# Patient Record
Sex: Female | Born: 1990 | Race: Black or African American | Hispanic: No | Marital: Single | Smoking: Never smoker
Health system: Southern US, Community
[De-identification: ages and names within clinical notes are randomized; demographics above are authoritative.]

## PROBLEM LIST (undated history)

## (undated) DIAGNOSIS — I10 Essential (primary) hypertension: Secondary | ICD-10-CM

## (undated) DIAGNOSIS — N201 Calculus of ureter: Secondary | ICD-10-CM

## (undated) DIAGNOSIS — Z789 Other specified health status: Secondary | ICD-10-CM

## (undated) DIAGNOSIS — Z973 Presence of spectacles and contact lenses: Secondary | ICD-10-CM

## (undated) HISTORY — DX: Other specified health status: Z78.9

---
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
## 2013-04-10 HISTORY — PX: WISDOM TOOTH EXTRACTION: SHX21

## 2013-07-31 ENCOUNTER — Encounter (HOSPITAL_COMMUNITY): Payer: Self-pay | Admitting: Emergency Medicine

## 2013-07-31 ENCOUNTER — Emergency Department (HOSPITAL_COMMUNITY): Payer: BC Managed Care – PPO

## 2013-07-31 ENCOUNTER — Emergency Department (HOSPITAL_COMMUNITY)
Admission: EM | Admit: 2013-07-31 | Discharge: 2013-07-31 | Disposition: A | Discharge status: Home / Self Care | Payer: BC Managed Care – PPO | Attending: Emergency Medicine | Admitting: Emergency Medicine

## 2013-07-31 DIAGNOSIS — R7989 Other specified abnormal findings of blood chemistry: Secondary | ICD-10-CM

## 2013-07-31 DIAGNOSIS — R944 Abnormal results of kidney function studies: Secondary | ICD-10-CM | POA: Insufficient documentation

## 2013-07-31 DIAGNOSIS — R350 Frequency of micturition: Secondary | ICD-10-CM | POA: Insufficient documentation

## 2013-07-31 DIAGNOSIS — M545 Low back pain, unspecified: Secondary | ICD-10-CM | POA: Insufficient documentation

## 2013-07-31 DIAGNOSIS — N201 Calculus of ureter: Secondary | ICD-10-CM

## 2013-07-31 DIAGNOSIS — Z87440 Personal history of urinary (tract) infections: Secondary | ICD-10-CM | POA: Insufficient documentation

## 2013-07-31 DIAGNOSIS — H547 Unspecified visual loss: Secondary | ICD-10-CM | POA: Insufficient documentation

## 2013-07-31 DIAGNOSIS — N1330 Unspecified hydronephrosis: Secondary | ICD-10-CM

## 2013-07-31 LAB — COMPREHENSIVE METABOLIC PANEL
ALBUMIN: 4.9 g/dL (ref 3.5–5.2)
ALT: 11 U/L (ref 0–35)
AST: 21 U/L (ref 0–37)
Alkaline Phosphatase: 82 U/L (ref 39–117)
BUN: 14 mg/dL (ref 6–23)
CALCIUM: 10.6 mg/dL — AB (ref 8.4–10.5)
CHLORIDE: 100 meq/L (ref 96–112)
CO2: 23 meq/L (ref 19–32)
CREATININE: 1.19 mg/dL — AB (ref 0.50–1.10)
GFR calc Af Amer: 76 mL/min — ABNORMAL LOW (ref >90)
GFR calc non Af Amer: 65 mL/min — ABNORMAL LOW (ref >90)
GLUCOSE: 83 mg/dL (ref 70–99)
POTASSIUM: 4 meq/L (ref 3.7–5.3)
SODIUM: 139 meq/L (ref 137–147)
Total Bilirubin: 0.7 mg/dL (ref 0.3–1.2)
Total Protein: 8.9 g/dL — ABNORMAL HIGH (ref 6.0–8.3)

## 2013-07-31 LAB — CBC WITH DIFFERENTIAL/PLATELET
BASOS ABS: 0 10*3/uL (ref 0.0–0.1)
Basophils Relative: 0 % (ref 0–1)
Eosinophils Absolute: 0.1 10*3/uL (ref 0.0–0.7)
Eosinophils Relative: 1 % (ref 0–5)
HEMATOCRIT: 42.1 % (ref 36.0–46.0)
Hemoglobin: 14.1 g/dL (ref 12.0–15.0)
LYMPHS PCT: 18 % (ref 12–46)
Lymphs Abs: 2.7 10*3/uL (ref 0.7–4.0)
MCH: 29.4 pg (ref 26.0–34.0)
MCHC: 33.5 g/dL (ref 30.0–36.0)
MCV: 87.7 fL (ref 78.0–100.0)
MONO ABS: 1.6 10*3/uL — AB (ref 0.1–1.0)
Monocytes Relative: 10 % (ref 3–12)
NEUTROS PCT: 71 % (ref 43–77)
Neutro Abs: 10.6 10*3/uL — ABNORMAL HIGH (ref 1.7–7.7)
Platelets: 294 10*3/uL (ref 150–400)
RBC: 4.8 MIL/uL (ref 3.87–5.11)
RDW: 12.7 % (ref 11.5–15.5)
WBC: 14.9 10*3/uL — ABNORMAL HIGH (ref 4.0–10.5)

## 2013-07-31 LAB — LIPASE, BLOOD: Lipase: 20 U/L (ref 11–59)

## 2013-07-31 LAB — URINALYSIS, ROUTINE W REFLEX MICROSCOPIC
BILIRUBIN URINE: NEGATIVE
Glucose, UA: NEGATIVE mg/dL
Ketones, ur: >80 mg/dL — AB
LEUKOCYTES UA: NEGATIVE
NITRITE: NEGATIVE
PROTEIN: 30 mg/dL — AB
SPECIFIC GRAVITY, URINE: 1.035 — AB (ref 1.005–1.030)
Urobilinogen, UA: 1 mg/dL (ref 0.0–1.0)
pH: 6 (ref 5.0–8.0)

## 2013-07-31 LAB — POC URINE PREG, ED: PREG TEST UR: NEGATIVE

## 2013-07-31 LAB — URINE MICROSCOPIC-ADD ON

## 2013-07-31 MED ORDER — SODIUM CHLORIDE 0.9 % IV BOLUS (SEPSIS)
1000.0000 mL | Freq: Once | INTRAVENOUS | Status: AC
  Administered 2013-07-31: 1000 mL via INTRAVENOUS

## 2013-07-31 MED ORDER — MORPHINE SULFATE 4 MG/ML IJ SOLN
4.0000 mg | Freq: Once | INTRAMUSCULAR | Status: AC
  Administered 2013-07-31: 4 mg via INTRAVENOUS
  Filled 2013-07-31: qty 1

## 2013-07-31 MED ORDER — OXYCODONE-ACETAMINOPHEN 5-325 MG PO TABS
1.0000 | ORAL_TABLET | Freq: Once | ORAL | Status: AC
  Administered 2013-07-31: 1 via ORAL
  Filled 2013-07-31: qty 1

## 2013-07-31 MED ORDER — HYDROCODONE-ACETAMINOPHEN 5-325 MG PO TABS: 1.0000 | ORAL_TABLET | Freq: Four times a day (QID) | ORAL | Status: DC | PRN

## 2013-07-31 MED ORDER — PROMETHAZINE HCL 25 MG PO TABS: 25.0000 mg | ORAL_TABLET | Freq: Four times a day (QID) | ORAL | Status: DC | PRN

## 2013-07-31 MED ORDER — TAMSULOSIN HCL 0.4 MG PO CAPS
0.4000 mg | ORAL_CAPSULE | Freq: Every day | ORAL | Status: DC
Start: 2013-07-31 — End: 2013-08-05

## 2013-07-31 MED ORDER — HYDROCODONE-ACETAMINOPHEN 5-325 MG PO TABS: 1.0000 | ORAL_TABLET | ORAL | Status: DC | PRN

## 2013-07-31 MED ORDER — ONDANSETRON HCL 4 MG/2ML IJ SOLN
4.0000 mg | Freq: Once | INTRAMUSCULAR | Status: AC
  Administered 2013-07-31: 4 mg via INTRAVENOUS
  Filled 2013-07-31: qty 2

## 2013-07-31 MED ORDER — KETOROLAC TROMETHAMINE 60 MG/2ML IM SOLN
60.0000 mg | Freq: Once | INTRAMUSCULAR | Status: AC
  Administered 2013-07-31: 60 mg via INTRAMUSCULAR
  Filled 2013-07-31: qty 2

## 2013-07-31 MED ORDER — PHENAZOPYRIDINE HCL 200 MG PO TABS
200.0000 mg | ORAL_TABLET | Freq: Three times a day (TID) | ORAL | Status: DC
Start: 2013-07-31 — End: 2017-01-16

## 2013-07-31 NOTE — ED Provider Notes (Signed)
CSN: [id number]     Arrival date & time 07/31/13  1130 History   First MD Initiated Contact with Patient 07/31/13 1218     Chief Complaint  Patient presents with  . Abdominal Pain     (Consider location/radiation/quality/duration/timing/severity/associated sxs/prior Treatment) HPI  Misty Thompson is a(n) 23 y.o. female who presents to the emergency department from [facility]. Patient states that she was told that she needs to come to the ED for r/o of appendicits. Patient states that last night she developed abdominal pain in her epigastrium, Lower abdomen and lower back. She states that she has been having several days of urinary frequency. . Patient states that last night her pain became very bad. She one episode of vomiting. She denies a history of urinary tract infections, history of kidney stones, previous abdominal surgeries. LMP 2 weeks ago.  She endorses chills without fever. Denies vaginal sxs.   History reviewed. No pertinent past medical history. History reviewed. No pertinent past surgical history. No family history on file. History  Substance Use Topics  . Smoking status: Never Smoker   . Smokeless tobacco: Not on file  . Alcohol Use: No   OB History   Grav Para Term Preterm Abortions TAB SAB Ect Mult Living            0     Review of Systems   Ten systems reviewed and are negative for acute change, except as noted in the HPI.   Allergies  Review of patient's allergies indicates no known allergies.  Home Medications   Prior to Admission medications   Medication Sig Start Date End Date Taking? Authorizing Provider  ibuprofen (ADVIL,MOTRIN) 200 MG tablet Take 200 mg by mouth every 6 (six) hours as needed.   Yes Historical Provider, MD  medroxyPROGESTERone (DEPO-PROVERA) 150 MG/ML injection Inject 150 mg into the muscle every 3 (three) months.   Yes Historical Provider, MD   BP 126/74  Pulse 65  Temp(Src) 98.9 F (37.2 C) (Oral)  Resp 18  SpO2  100% Physical Exam Physical Exam  Nursing note and vitals reviewed. Constitutional: She is oriented to person, place, and time. She appears well-developed and well-nourished. No distress.  HENT:  Head: Normocephalic and atraumatic.  Eyes: Conjunctivae normal and EOM are normal. Pupils are equal, round, and reactive to light. No scleral icterus.  Neck: Normal range of motion.  Cardiovascular: Normal rate, regular rhythm and normal heart sounds.  Exam reveals no gallop and no friction rub.   No murmur heard. Pulmonary/Chest: Effort normal and breath sounds normal. No respiratory distress.  Abdominal: Diffuse abdominal tenderness. No rebound, no guarding. No masses. + cva tenderness R side.  Neurological: She is alert and oriented to person, place, and time.  Skin: Skin is warm and dry. She is not diaphoretic.    ED Course  Procedures (including critical care time) Labs Review Labs Reviewed  CBC WITH DIFFERENTIAL - Abnormal; Notable for the following:    WBC 14.9 (*)    Neutro Abs 10.6 (*)    Monocytes Absolute 1.6 (*)    All other components within normal limits  COMPREHENSIVE METABOLIC PANEL - Abnormal; Notable for the following:    Creatinine, Ser 1.19 (*)    Calcium 10.6 (*)    Total Protein 8.9 (*)    GFR calc non Af Amer 65 (*)    GFR calc Af Amer 76 (*)    All other components within normal limits  LIPASE, BLOOD  URINALYSIS,  ROUTINE W REFLEX MICROSCOPIC  POC URINE PREG, ED    Imaging Review No results found.   EKG Interpretation None      MDM   Final diagnoses:  None    Patient here for abdominal pain. I have very low suspicion for acute appendicitis. I believe her sxs are secondary to  A UTI. Patient has diffuse tenderness. Will awai labs. Pain control initiated.  4:48 PM Filed Vitals:   07/31/13 1315 07/31/13 1343 07/31/13 1345 07/31/13 1457  BP: 137/80 135/73 129/64 125/68  Pulse: 87 63 60 62  Temp:      TempSrc:      Resp: 17 18 20 20   SpO2: 100%  100% 100% 99%    Patient labs show leukocytosis. She awaiting a UA / urine preg.patient given pain medicaiton nand on repeat exam she has no focal abdominal tenderness. Patient has elevated creatinine and should not get any IV contrast if her urine comes back negative.  I anticipate discharge.     Arthor CaptainAbigail Harris, PA-C 07/31/13 1652

## 2013-07-31 NOTE — ED Provider Notes (Signed)
4:32 PM Patient signed out to me at end of shift by Arthor CaptainAbigail Harris, PA-C.  Pt with urinary frequency, abdominal pain, vomiting, chills.  Sent from urgent care for r/o appendicitis.  Per PA Harris, exam has not been concerning for appendicitis but is concerning for early pyelonephritis.  She has right CVA tenderness. UA pending.  Pt also reported pain free with pain medication.    5:47 PM Patient reports pain is improved.  Pain is indicated on right flank, is intermittent, moves location.  UA shows hematuria, pt not menstruating.  Mother has hx kidney stones.  CT abd/pelvis without contrast.  Pt no longer has IV.  Discussed with PA Harris prior to her leaving, as she doubts appendicitis and pt has renal insufficiency, will not scan with contrast.  Pt also has negative McBurney's point tenderness.  Doubt appendicitis.    6:42 PM Patient with right ureteral stone.  D/C home with vicodin (patient preference), nausea medications, flomax, urology follow up.  Discussed result, findings, treatment, and follow up  with patient.  Pt given return precautions.  Pt verbalizes understanding and agrees with plan.       Results for orders placed during the hospital encounter of 07/31/13  CBC WITH DIFFERENTIAL      Result Value Ref Range   WBC 14.9 (*) 4.0 - 10.5 K/uL   RBC 4.80  3.87 - 5.11 MIL/uL   Hemoglobin 14.1  12.0 - 15.0 g/dL   HCT 16.142.1  09.636.0 - 04.546.0 %   MCV 87.7  78.0 - 100.0 fL   MCH 29.4  26.0 - 34.0 pg   MCHC 33.5  30.0 - 36.0 g/dL   RDW 40.912.7  81.111.5 - 91.415.5 %   Platelets 294  150 - 400 K/uL   Neutrophils Relative % 71  43 - 77 %   Neutro Abs 10.6 (*) 1.7 - 7.7 K/uL   Lymphocytes Relative 18  12 - 46 %   Lymphs Abs 2.7  0.7 - 4.0 K/uL   Monocytes Relative 10  3 - 12 %   Monocytes Absolute 1.6 (*) 0.1 - 1.0 K/uL   Eosinophils Relative 1  0 - 5 %   Eosinophils Absolute 0.1  0.0 - 0.7 K/uL   Basophils Relative 0  0 - 1 %   Basophils Absolute 0.0  0.0 - 0.1 K/uL  COMPREHENSIVE METABOLIC PANEL   Result Value Ref Range   Sodium 139  137 - 147 mEq/L   Potassium 4.0  3.7 - 5.3 mEq/L   Chloride 100  96 - 112 mEq/L   CO2 23  19 - 32 mEq/L   Glucose, Bld 83  70 - 99 mg/dL   BUN 14  6 - 23 mg/dL   Creatinine, Ser 7.821.19 (*) 0.50 - 1.10 mg/dL   Calcium 95.610.6 (*) 8.4 - 10.5 mg/dL   Total Protein 8.9 (*) 6.0 - 8.3 g/dL   Albumin 4.9  3.5 - 5.2 g/dL   AST 21  0 - 37 U/L   ALT 11  0 - 35 U/L   Alkaline Phosphatase 82  39 - 117 U/L   Total Bilirubin 0.7  0.3 - 1.2 mg/dL   GFR calc non Af Amer 65 (*) >90 mL/min   GFR calc Af Amer 76 (*) >90 mL/min  LIPASE, BLOOD      Result Value Ref Range   Lipase 20  11 - 59 U/L  URINALYSIS, ROUTINE W REFLEX MICROSCOPIC      Result Value Ref  Range   Color, Urine YELLOW  YELLOW   APPearance CLEAR  CLEAR   Specific Gravity, Urine 1.035 (*) 1.005 - 1.030   pH 6.0  5.0 - 8.0   Glucose, UA NEGATIVE  NEGATIVE mg/dL   Hgb urine dipstick LARGE (*) NEGATIVE   Bilirubin Urine NEGATIVE  NEGATIVE   Ketones, ur >80 (*) NEGATIVE mg/dL   Protein, ur 30 (*) NEGATIVE mg/dL   Urobilinogen, UA 1.0  0.0 - 1.0 mg/dL   Nitrite NEGATIVE  NEGATIVE   Leukocytes, UA NEGATIVE  NEGATIVE  URINE MICROSCOPIC-ADD ON      Result Value Ref Range   Squamous Epithelial / LPF FEW (*) RARE   RBC / HPF 7-10  <3 RBC/hpf   Urine-Other MUCOUS PRESENT    POC URINE PREG, ED      Result Value Ref Range   Preg Test, Ur NEGATIVE  NEGATIVE   Ct Abdomen Pelvis Wo Contrast  07/31/2013   CLINICAL DATA:  RIGHT flank pain, microscopic hematuria, upper epigastric pain, constipation  EXAM: CT ABDOMEN AND PELVIS WITHOUT CONTRAST  TECHNIQUE: Multidetector CT imaging of the abdomen and pelvis was performed following the standard protocol without IV contrast. Sagittal and coronal MPR images reconstructed from axial data set. Oral contrast not administered for this indication.  COMPARISON:  COMPARISON None  FINDINGS: Lung bases clear.  RIGHT hydronephrosis and hydroureter secondary to a 3 mm distal RIGHT  ureteral calculus image 69.  Bladder decompressed.  No additional urinary tract calcifications or LEFT hydroureteronephrosis.  Within limits of a nonenhanced exam, liver, spleen, pancreas, kidneys, and adrenal glands otherwise unremarkable.  Normal appendix.  Uterus and adnexae unremarkable.  Stomach and bowel loops grossly normal appearance for technique.  No mass, adenopathy, free fluid, or free air.  Bones unremarkable.  IMPRESSION: RIGHT hydronephrosis and hydroureter secondary to a 3 mm distal RIGHT ureteral calculus.   Electronically Signed   By: Ulyses SouthwardMark  Boles M.D.   On: 07/31/2013 18:11      Trixie Dredgemily West, PA-C 07/31/13 1842

## 2013-07-31 NOTE — Discharge Instructions (Signed)
You have been seen today for your complaint of pain with urination. Your lab work showed kidney stone.  Your discharge medications include: 1) Norco=Do not drive, operate heavy machinery, drink alcohol, or take other tylenol containing products with this medicine.] 2) Pyridium This medication will help relieve pain and burning but does not treat the infection.  Make sure that you wear a panty liner as it may stain your underwear. Home care instructions are as follows:  1) please drink plenty of water, avoid tea and beverages with caffeine like coffee or soda 2) if you are sexually active, ,make sure to urinate immediately after intercourse Follow up with: your doctor or the emergency department Please seek immediate medical care if you develop any of the following symptoms: SEEK MEDICAL CARE IF:  You have back pain.  You develop a fever.  Your symptoms do not begin to resolve within 3 days.  SEEK IMMEDIATE MEDICAL CARE IF:  You have severe back pain or lower abdominal pain.  You develop chills.  You have nausea or vomiting.  You have continued burning or discomfort with urination.  Read the information below.  Use the prescribed medication as directed.  Please discuss all new medications with your pharmacist.  Do not take additional tylenol while taking the prescribed pain medication to avoid overdose.  You may return to the Emergency Department at any time for worsening condition or any new symptoms that concern you.  If you develop high fevers, worsening abdominal or flank pain, uncontrolled vomiting, or are unable to tolerate fluids by mouth, return to the ER for a recheck.      Kidney Stones Kidney stones (urolithiasis) are deposits that form inside your kidneys. The intense pain is caused by the stone moving through the urinary tract. When the stone moves, the ureter goes into spasm around the stone. The stone is usually passed in the urine.  CAUSES   A disorder that makes certain  neck glands produce too much parathyroid hormone (primary hyperparathyroidism).  A buildup of uric acid crystals, similar to gout in your joints.  Narrowing (stricture) of the ureter.  A kidney obstruction present at birth (congenital obstruction).  Previous surgery on the kidney or ureters.  Numerous kidney infections. SYMPTOMS   Feeling sick to your stomach (nauseous).  Throwing up (vomiting).  Blood in the urine (hematuria).  Pain that usually spreads (radiates) to the groin.  Frequency or urgency of urination. DIAGNOSIS   Taking a history and physical exam.  Blood or urine tests.  CT scan.  Occasionally, an examination of the inside of the urinary bladder (cystoscopy) is performed. TREATMENT   Observation.  Increasing your fluid intake.  Extracorporeal shock wave lithotripsy--This is a noninvasive procedure that uses shock waves to break up kidney stones.  Surgery may be needed if you have severe pain or persistent obstruction. There are various surgical procedures. Most of the procedures are performed with the use of small instruments. Only small incisions are needed to accommodate these instruments, so recovery time is minimized. The size, location, and chemical composition are all important variables that will determine the proper choice of action for you. Talk to your health care provider to better understand your situation so that you will minimize the risk of injury to yourself and your kidney.  HOME CARE INSTRUCTIONS   Drink enough water and fluids to keep your urine clear or pale yellow. This will help you to pass the stone or stone fragments.  Strain all urine through  the provided strainer. Keep all particulate matter and stones for your health care provider to see. The stone causing the pain may be as small as a grain of salt. It is very important to use the strainer each and every time you pass your urine. The collection of your stone will allow your health  care provider to analyze it and verify that a stone has actually passed. The stone analysis will often identify what you can do to reduce the incidence of recurrences.  Only take over-the-counter or prescription medicines for pain, discomfort, or fever as directed by your health care provider.  Make a follow-up appointment with your health care provider as directed.  Get follow-up X-rays if required. The absence of pain does not always mean that the stone has passed. It may have only stopped moving. If the urine remains completely obstructed, it can cause loss of kidney function or even complete destruction of the kidney. It is your responsibility to make sure X-rays and follow-ups are completed. Ultrasounds of the kidney can show blockages and the status of the kidney. Ultrasounds are not associated with any radiation and can be performed easily in a matter of minutes. SEEK MEDICAL CARE IF:  You experience pain that is progressive and unresponsive to any pain medicine you have been prescribed. SEEK IMMEDIATE MEDICAL CARE IF:   Pain cannot be controlled with the prescribed medicine.  You have a fever or shaking chills.  The severity or intensity of pain increases over 18 hours and is not relieved by pain medicine.  You develop a new onset of abdominal pain.  You feel faint or pass out.  You are unable to urinate. MAKE SURE YOU:   Understand these instructions.  Will watch your condition.  Will get help right away if you are not doing well or get worse. Document Released: 01/27/2005 Document Revised: 09/29/2012 Document Reviewed: 06/30/2012 [facility] Patient Information 2015 [employer] [place] [place]. This information is not intended to replace advice given to you by your health care provider. Make sure you discuss any questions you have with your health care provider.

## 2013-07-31 NOTE — ED Notes (Addendum)
[facility] reports 10[facility]10 RLQ pain since last night progressively worsening with nausea. Sent for r/o appendicitis. [facility] Pt reports chills last night. Pt in NAD.

## 2013-07-31 NOTE — ED Notes (Signed)
Unable to est IV access. Asking another nurse to try

## 2013-07-31 NOTE — ED Notes (Signed)
PT states she started having epigastric, lower abd, and R flank pain last night. PT has been nauseous w/ emesis x1 and constipated. States she's felt like she had fever and chills since yesterday. PT reports 10/10 pain. Denies hx of ovarian cysts or kidney stones.

## 2013-08-01 LAB — URINE CULTURE: Colony Count: 10000

## 2013-08-01 NOTE — ED Provider Notes (Signed)
Medical screening examination/treatment/procedure(s) were performed by non-physician practitioner and as supervising physician I was immediately available for consultation/collaboration.   EKG Interpretation None        Candyce ChurnJohn David Wofford III, MD 08/01/13 [id number]

## 2013-08-01 NOTE — ED Provider Notes (Signed)
Medical screening examination/treatment/procedure(s) were performed by non-physician practitioner and as supervising physician I was immediately available for consultation/collaboration.   EKG Interpretation None        Candyce ChurnJohn David Wofford III, MD 08/01/13 [phone]

## 2013-08-04 ENCOUNTER — Encounter (HOSPITAL_BASED_OUTPATIENT_CLINIC_OR_DEPARTMENT_OTHER): Payer: Self-pay | Admitting: *Deleted

## 2013-08-04 ENCOUNTER — Other Ambulatory Visit: Payer: Self-pay | Admitting: Urology

## 2013-08-04 NOTE — Progress Notes (Signed)
NPO AFTER MN. ARRIVE AT 0600. NEEDS KUB.  CURRENT LAB RESULTS IN CHART AND EPIC. MAY TAKE RX PAIN/ NAUSEA IF NEEDED W/ SIPS OF WATER AM DOS.

## 2013-08-04 NOTE — Anesthesia Preprocedure Evaluation (Addendum)
Anesthesia Evaluation  Patient identified by MRN, date of birth, ID band Patient awake    Reviewed: Allergy & Precautions, H&P , NPO status , Patient's Chart, lab work & pertinent test results  Airway Mallampati: II TM Distance: >3 FB Neck ROM: Full    Dental  (+) Dental Advisory Given   Pulmonary neg pulmonary ROS,  breath sounds clear to auscultation  Pulmonary exam normal       Cardiovascular negative cardio ROS  Rhythm:Regular Rate:Normal     Neuro/Psych negative neurological ROS  negative psych ROS   GI/Hepatic negative GI ROS, Neg liver ROS,   Endo/Other  negative endocrine ROS  Renal/GU negative Renal ROS  negative genitourinary   Musculoskeletal negative musculoskeletal ROS (+)   Abdominal   Peds  Hematology negative hematology ROS (+)   Anesthesia Other Findings Braces  Reproductive/Obstetrics negative OB ROS                          Anesthesia Physical Anesthesia Plan  ASA: I  Anesthesia Plan: General   Post-op Pain Management:    Induction: Intravenous  Airway Management Planned: LMA  Additional Equipment:   Intra-op Plan:   Post-operative Plan: Extubation in OR  Informed Consent: I have reviewed the patients History and Physical, chart, labs and discussed the procedure including the risks, benefits and alternatives for the proposed anesthesia with the patient or authorized representative who has indicated his/her understanding and acceptance.   Dental advisory given  Plan Discussed with: CRNA  Anesthesia Plan Comments:         Anesthesia Quick Evaluation

## 2013-08-05 ENCOUNTER — Ambulatory Visit (HOSPITAL_BASED_OUTPATIENT_CLINIC_OR_DEPARTMENT_OTHER): Payer: BC Managed Care – PPO | Admitting: Anesthesiology

## 2013-08-05 ENCOUNTER — Ambulatory Visit (HOSPITAL_COMMUNITY): Payer: BC Managed Care – PPO

## 2013-08-05 ENCOUNTER — Encounter (HOSPITAL_BASED_OUTPATIENT_CLINIC_OR_DEPARTMENT_OTHER): Payer: Self-pay | Admitting: *Deleted

## 2013-08-05 ENCOUNTER — Encounter (HOSPITAL_BASED_OUTPATIENT_CLINIC_OR_DEPARTMENT_OTHER): Payer: BC Managed Care – PPO | Admitting: Anesthesiology

## 2013-08-05 ENCOUNTER — Ambulatory Visit (HOSPITAL_BASED_OUTPATIENT_CLINIC_OR_DEPARTMENT_OTHER)
Admission: RE | Admit: 2013-08-05 | Discharge: 2013-08-05 | Disposition: A | Discharge status: Home / Self Care | Payer: BC Managed Care – PPO | Source: Ambulatory Visit | Attending: Urology | Admitting: Urology

## 2013-08-05 ENCOUNTER — Encounter (HOSPITAL_BASED_OUTPATIENT_CLINIC_OR_DEPARTMENT_OTHER)
Admission: RE | Disposition: A | Discharge status: Home / Self Care | Payer: Self-pay | Source: Ambulatory Visit | Attending: Urology

## 2013-08-05 DIAGNOSIS — Z79899 Other long term (current) drug therapy: Secondary | ICD-10-CM | POA: Insufficient documentation

## 2013-08-05 DIAGNOSIS — N201 Calculus of ureter: Secondary | ICD-10-CM | POA: Insufficient documentation

## 2013-08-05 HISTORY — DX: Presence of spectacles and contact lenses: Z97.3

## 2013-08-05 HISTORY — PX: CYSTOSCOPY WITH RETROGRADE PYELOGRAM, URETEROSCOPY AND STENT PLACEMENT: SHX5789

## 2013-08-05 HISTORY — DX: Calculus of ureter: N20.1

## 2013-08-05 SURGERY — CYSTOURETEROSCOPY, WITH RETROGRADE PYELOGRAM AND STENT INSERTION
Anesthesia: General | Site: Ureter | Laterality: Right

## 2013-08-05 MED ORDER — LACTATED RINGERS IV SOLN
INTRAVENOUS | Status: DC
  Filled 2013-08-05: qty 1000

## 2013-08-05 MED ORDER — ACETAMINOPHEN 10 MG/ML IV SOLN
INTRAVENOUS | Status: DC | PRN
  Administered 2013-08-05: 1000 mg via INTRAVENOUS

## 2013-08-05 MED ORDER — IOHEXOL 350 MG/ML SOLN
INTRAVENOUS | Status: DC | PRN
  Administered 2013-08-05: 35 mL

## 2013-08-05 MED ORDER — KETOROLAC TROMETHAMINE 30 MG/ML IJ SOLN
INTRAMUSCULAR | Status: DC | PRN
  Administered 2013-08-05: 30 mg via INTRAVENOUS

## 2013-08-05 MED ORDER — PROMETHAZINE HCL 25 MG/ML IJ SOLN
6.2500 mg | INTRAMUSCULAR | Status: DC | PRN
  Filled 2013-08-05: qty 1

## 2013-08-05 MED ORDER — MIDAZOLAM HCL 5 MG/5ML IJ SOLN
INTRAMUSCULAR | Status: DC | PRN
  Administered 2013-08-05 (×2): 1 mg via INTRAVENOUS

## 2013-08-05 MED ORDER — CEFAZOLIN SODIUM 1-5 GM-% IV SOLN
1.0000 g | INTRAVENOUS | Status: DC
  Filled 2013-08-05: qty 50

## 2013-08-05 MED ORDER — ONDANSETRON HCL 4 MG/2ML IJ SOLN
INTRAMUSCULAR | Status: DC | PRN
  Administered 2013-08-05: 4 mg via INTRAVENOUS

## 2013-08-05 MED ORDER — LIDOCAINE HCL (CARDIAC) 20 MG/ML IV SOLN
INTRAVENOUS | Status: DC | PRN
  Administered 2013-08-05: 60 mg via INTRAVENOUS

## 2013-08-05 MED ORDER — FENTANYL CITRATE 0.05 MG/ML IJ SOLN
INTRAMUSCULAR | Status: DC | PRN
  Administered 2013-08-05: 50 ug via INTRAVENOUS
  Administered 2013-08-05 (×2): 25 ug via INTRAVENOUS
  Administered 2013-08-05: 50 ug via INTRAVENOUS

## 2013-08-05 MED ORDER — HYDROMORPHONE HCL PF 1 MG/ML IJ SOLN
0.2500 mg | INTRAMUSCULAR | Status: DC | PRN
  Filled 2013-08-05: qty 1

## 2013-08-05 MED ORDER — FENTANYL CITRATE 0.05 MG/ML IJ SOLN
INTRAMUSCULAR | Status: AC
  Filled 2013-08-05: qty 4

## 2013-08-05 MED ORDER — SODIUM CHLORIDE 0.9 % IR SOLN
Status: DC | PRN
  Administered 2013-08-05: 4000 mL

## 2013-08-05 MED ORDER — DEXAMETHASONE SODIUM PHOSPHATE 4 MG/ML IJ SOLN
INTRAMUSCULAR | Status: DC | PRN
  Administered 2013-08-05: 10 mg via INTRAVENOUS

## 2013-08-05 MED ORDER — PHENAZOPYRIDINE HCL 100 MG PO TABS
ORAL_TABLET | ORAL | Status: AC
  Filled 2013-08-05: qty 2

## 2013-08-05 MED ORDER — PROPOFOL 10 MG/ML IV BOLUS
INTRAVENOUS | Status: DC | PRN
  Administered 2013-08-05: 200 mg via INTRAVENOUS

## 2013-08-05 MED ORDER — MIDAZOLAM HCL 2 MG/2ML IJ SOLN
INTRAMUSCULAR | Status: AC
Start: 2013-08-05 — End: 2013-08-05
  Filled 2013-08-05: qty 2

## 2013-08-05 MED ORDER — LACTATED RINGERS IV SOLN
INTRAVENOUS | Status: DC
  Administered 2013-08-05: 07:00:00 via INTRAVENOUS
  Filled 2013-08-05: qty 1000

## 2013-08-05 MED ORDER — CEFAZOLIN SODIUM-DEXTROSE 2-3 GM-% IV SOLR
2.0000 g | INTRAVENOUS | Status: AC
  Administered 2013-08-05: 2 g via INTRAVENOUS
  Filled 2013-08-05: qty 50

## 2013-08-05 MED ORDER — PHENAZOPYRIDINE HCL 200 MG PO TABS
200.0000 mg | ORAL_TABLET | Freq: Three times a day (TID) | ORAL | Status: DC | PRN
  Administered 2013-08-05: 200 mg via ORAL
  Filled 2013-08-05: qty 1

## 2013-08-05 SURGICAL SUPPLY — 39 items
ADAPTER CATH URET PLST 4-6FR (CATHETERS) IMPLANT
ADPR CATH URET STRL DISP 4-6FR (CATHETERS)
BAG DRAIN URO-CYSTO SKYTR STRL (DRAIN) ×3 IMPLANT
BAG DRN UROCATH (DRAIN) ×2
BASKET LASER NITINOL 1.9FR (BASKET) IMPLANT
BASKET STNLS GEMINI 4WIRE 3FR (BASKET) IMPLANT
BASKET ZERO TIP NITINOL 2.4FR (BASKET) ×2 IMPLANT
BSKT STON RTRVL 120 1.9FR (BASKET)
BSKT STON RTRVL GEM 120X11 3FR (BASKET)
BSKT STON RTRVL ZERO TP 2.4FR (BASKET) ×2
CANISTER SUCT LVC 12 LTR MEDI- (MISCELLANEOUS) ×2 IMPLANT
CATH INTERMIT  6FR 70CM (CATHETERS) IMPLANT
CATH URET 5FR 28IN CONE TIP (BALLOONS) ×1
CATH URET 5FR 28IN OPEN ENDED (CATHETERS) IMPLANT
CATH URET 5FR 70CM CONE TIP (BALLOONS) ×1 IMPLANT
CLOTH BEACON ORANGE TIMEOUT ST (SAFETY) ×3 IMPLANT
CONTOUR ×2 IMPLANT
DRAPE CAMERA CLOSED 9X96 (DRAPES) IMPLANT
FIBER LASER FLEXIVA 1000 (UROLOGICAL SUPPLIES) IMPLANT
FIBER LASER FLEXIVA 200 (UROLOGICAL SUPPLIES) IMPLANT
FIBER LASER FLEXIVA 365 (UROLOGICAL SUPPLIES) IMPLANT
FIBER LASER FLEXIVA 550 (UROLOGICAL SUPPLIES) IMPLANT
GLOVE BIO SURGEON STRL SZ7 (GLOVE) ×3 IMPLANT
GLOVE BIOGEL M STER SZ 6 (GLOVE) ×2 IMPLANT
GLOVE BIOGEL PI IND STRL 6.5 (GLOVE) ×2 IMPLANT
GLOVE BIOGEL PI INDICATOR 6.5 (GLOVE) ×2
GOWN STRL REUS W/TWL LRG LVL3 (GOWN DISPOSABLE) ×4 IMPLANT
GUIDEWIRE 0.038 PTFE COATED (WIRE) ×3 IMPLANT
GUIDEWIRE ANG ZIPWIRE 038X150 (WIRE) IMPLANT
GUIDEWIRE STR DUAL SENSOR (WIRE) ×2 IMPLANT
KIT BALLIN UROMAX 15FX10 (LABEL) IMPLANT
KIT BALLN UROMAX 15FX4 (MISCELLANEOUS) IMPLANT
KIT BALLN UROMAX 26 75X4 (MISCELLANEOUS)
NS IRRIG 500ML POUR BTL (IV SOLUTION) IMPLANT
PACK CYSTOSCOPY (CUSTOM PROCEDURE TRAY) ×3 IMPLANT
SET HIGH PRES BAL DIL (LABEL)
SHEATH URET ACCESS 12FR/35CM (UROLOGICAL SUPPLIES) ×2 IMPLANT
SHEATH URET ACCESS 12FR/55CM (UROLOGICAL SUPPLIES) IMPLANT
STENT URET 6FRX24 CONTOUR (STENTS) ×2 IMPLANT

## 2013-08-05 NOTE — Op Note (Signed)
Antony Hastealiyah M Hatton is a 23 y.o.   08/05/2013  General  Pre-op diagnosis: Right distal ureteral calculus.  Postop diagnosis: Same  Procedure done: Cystoscopy, right retrograde pyelogram, ureteroscopy with stone extraction, insertion of double-J stent  Surgeon: Wendie SimmerMarc H. Nesi  Anesthesia: General  Indication: Patient is a 23 years old female who was seen in emergency room on 06/20 for sudden onset of severe right flank pain associated with nausea and vomiting. CT scan showed a 3 mm stone in the right distal ureter. She was sent home on analgesics and tamsulosin. She has continued to have pain. She takes pain medication every 4 hours. And she has not passed the stone. KUB this morning showed a stone in the right and distal ureter. She is scheduled now for cystoscopy and stone manipulation  Procedure: The patient was identified by her wrist band and proper timeout was taken.  Under general anesthesia she was prepped and draped and placed in the dorsolithotomy position. A panendoscope was inserted in the bladder. The bladder mucosa is normal. There is no stone or tumor in the bladder. The ureteral orifices are in normal position and shape.  Right retrograde pyelogram:  A cone-tip catheter was passed through the cystoscope and the right ureteral orifice. Contrast was then injected through the cone-tip catheter. There is a filling defect in the distal ureter. The ureter proximal to the filling defect is moderately dilated. The cone-tip catheter was removed.  A sensor wire was passed through the cystoscope and the right ureter. The cystoscope was removed. A semirigid ureteroscope was then passed in the bladder but could not be passed through the ureteral orifice. The ureteroscope was removed. I then dilated the ureteral orifice with a ureteroscope access sheath. The ureteroscope was then passed in the bladder and through the ureter without difficulty. A small stone was identified in the distal ureter.  It was felt that this stone was small enough to be extracted without fragmentation. A Nitinol basket was passed and through the ureteroscope and the stone was caught within the wires of the basket and removed. The ureteroscope was then reinserted in the ureter. Contrast was injected through the ureteroscope. There is no evidence of remaining filling defect in the ureter and there is no extravasation of contrast. The mid and proximal ureter are normal. The renal pelvis and calyces are also normal. The ureteroscope was removed.  The sensor wire was backloaded into the cystoscope and a #6 [place] last 24 double-J stent was passed over the sensor wire. The proximal curl of the double-J stent is in the renal pelvis and the distal curl is in the bladder. The bladder was then emptied and the cystoscope and the sensor wire were removed.  Patient tolerated the procedure well and left the OR in satisfactory condition to postanesthesia care unit.

## 2013-08-05 NOTE — Transfer of Care (Signed)
Immediate Anesthesia Transfer of Care Note  Patient: Misty Thompson  Procedure(s) Performed: Procedure(s) (LRB): CYSTOSCOPY WITH RETROGRADE PYELOGRAM, URETEROSCOPY AND STENT PLACEMENT (Right)  Patient Location: PACU  Anesthesia Type: General  Level of Consciousness: awake, alert  and oriented  Airway & Oxygen Therapy: Patient Spontanous Breathing and Patient connected to face mask oxygen  Post-op Assessment: Report given to PACU RN and Post -op Vital signs reviewed and stable  Post vital signs: Reviewed and stable  Complications: No apparent anesthesia complications

## 2013-08-05 NOTE — H&P (Addendum)
History of Present Illness Misty Thompson was seen in the ER on 6/20 for sudden onset of severe right flank pain associated with nausea and vomiting. CT scan showed a 3 mm stone in the right distal ureter. She was sent home on analgesics and tamsulosin. She has not passed a stone. She has been having right flank pain radiating to the right lower quadrant since. She takes pain medication every 4 hours. She does not have a past history of kidney stone.   Past Medical History Problems  1. History of renal calculi (V13.01)  Surgical History Problems  1. History of Oral Surgery  Current Meds 1. Hydrocodone-Acetaminophen TABS;  Therapy: (Recorded:25Jun2015) to Recorded 2. Phenergan TABS (Promethazine HCl);  Therapy: (Recorded:25Jun2015) to Recorded 3. Tamsulosin HCl - 0.4 MG Oral Capsule;  Therapy: (Recorded:25Jun2015) to Recorded  Allergies Medication  1. No Known Drug Allergies  Family History Problems  1. Family history of hypertension (V17.49) : Mother, Father  Social History Problems    Denied: History of Alcohol use   Denied: History of Caffeine use   Father alive and healthy   5752   Mother alive and healthy   6149   Never a smoker   Occupation   Consulting civil engineerstudent   Single  Review of Systems Genitourinary, constitutional, skin, eye, otolaryngeal, hematologic/lymphatic, cardiovascular, pulmonary, endocrine, musculoskeletal, gastrointestinal, neurological and psychiatric system(s) were reviewed and pertinent findings if present are noted.  Gastrointestinal: nausea, vomiting, flank pain and constipation1 .     1 Amended By: Su GrandNesi, Marc; Aug 04 2013 3:31 PM EST  Vitals Vital Signs [Data Includes: Last 1 Day]  Recorded: 25Jun2015 02:43PM  Height: 5 ft 2 in Weight: 164 lb  BMI Calculated: 30 BSA Calculated: 1.76 Blood Pressure: 118 / 74 Temperature: 98.4 F Heart Rate: 72 Respiration: 18  Physical Exam Constitutional: Well nourished and well developed . No acute distress.   ENT:. The ears and nose are normal in appearance.  Neck: The appearance of the neck is normal and no neck mass is present.  Pulmonary: No respiratory distress and normal respiratory rhythm and effort.  Cardiovascular: Heart rate and rhythm are normal . No peripheral edema.  Abdomen: The abdomen is soft and nontender. No masses are palpated. No CVA tenderness. No hernias are palpable. No hepatosplenomegaly noted.  Genitourinary:  The bladder is non tender and not distended.  Lymphatics: The femoral and inguinal nodes are not enlarged or tender.  Skin: Normal skin turgor, no visible rash and no visible skin lesions.  Neuro/Psych:. Mood and affect are appropriate.    Results/Data Urine [Data Includes: Last 1 Day]   25Jun2015  COLOR YELLOW   APPEARANCE CLEAR   SPECIFIC GRAVITY 1.020   pH 6.0   GLUCOSE NEG mg/dL  BILIRUBIN NEG   KETONE NEG mg/dL  BLOOD LARGE   PROTEIN NEG mg/dL  UROBILINOGEN 0.2 mg/dL  NITRITE NEG   LEUKOCYTE ESTERASE NEG   SQUAMOUS EPITHELIAL/HPF RARE   WBC 0-2 WBC/hpf  RBC 3-6 RBC/hpf  BACTERIA NONE SEEN   CRYSTALS NONE SEEN   CASTS NONE SEEN    I independently reviewed the CT scan and the findings are as noted above.   Assessment Assessed  1. Calculus of distal right ureter (592.1)  Plan Health Maintenance  1. UA With REFLEX; [Do Not Release]; Status:Resulted - Requires Verification;   Done:  25Jun2015 01:28PM  Since she is still symptomatic and has not passed stone I believe she needs stone manipulation.The procedure, risks, benefits were reviewed with the patient  and her mother. The risks include but are not limited to hemorrhage, infection, ureteral injury, inability to find the stone or to extract it. They understand and are agreeable.

## 2013-08-05 NOTE — Discharge Instructions (Addendum)
Post Anesthesia Home Care Instructions  Activity: Get plenty of rest for the remainder of the day. A responsible adult should stay with you for 24 hours following the procedure.  For the next 24 hours, DO NOT: -Drive a car -Operate machinery -Drink alcoholic beverages -Take any medication unless instructed by your physician -Make any legal decisions or sign important papers.  Meals: Start with liquid foods such as gelatin or soup. Progress to regular foods as tolerated. Avoid greasy, spicy, heavy foods. If nausea and/or vomiting occur, drink only clear liquids until the nausea and/or vomiting subsides. Call your physician if vomiting continues.  Special Instructions/Symptoms: Your throat may feel dry or sore from the anesthesia or the breathing tube placed in your throat during surgery. If this causes discomfort, gargle with warm salt water. The discomfort should disappear within 24 hours.      [facility] [phone] Post Ureteroscopy With or Without Stent Instructions  Definitions:  Ureter: The duct that transports urine from the kidney to the bladder. Stent:   A plastic hollow tube that is placed into the ureter, from the kidney to the bladder to prevent the ureter from swelling shut.  GENERAL INSTRUCTIONS:  Despite the fact that no skin incisions were used, the area around the ureter and bladder is raw and irritated. The stent is a foreign body which will further irritate the bladder wall. This irritation is manifested by increased frequency of urination, both day and night, and by an increase in the urge to urinate. In some, the urge to urinate is present almost always. Sometimes the urge is strong enough that you may not be able to stop yourself from urinating. The only real cure is to remove the stent and then give time for the bladder wall to heal which can't be done until the danger of the ureter swelling shut has passed, which varies.  You may see some  blood in your urine while the stent is in place and a few days afterwards. Do not be alarmed, even if the urine was clear for a while. Get off your feet and drink lots of fluids until clearing occurs. If you start to pass clots or don't improve, call us.  DIET: You may return to your normal diet immediately. Because of the raw surface of your bladder, alcohol, spicy foods, acid type foods and drinks with caffeine may cause irritation or frequency and should be used in moderation. To keep your urine flowing freely and to avoid constipation, drink plenty of fluids during the day ( 8-10 glasses ). Tip: Avoid cranberry juice because it is very acidic.  ACTIVITY: Your physical activity doesn't need to be restricted. However, if you are very active, you may see some blood in your urine. We suggest that you reduce your activity under these circumstances until the bleeding has stopped.  BOWELS: It is important to keep your bowels regular during the postoperative period. Straining with bowel movements can cause bleeding. A bowel movement every other day is reasonable. Use a mild laxative if needed, such as Milk of Magnesia 2-3 tablespoons, or 2 Dulcolax tablets. Call if you continue to have problems. If you have been taking narcotics for pain, before, during or after your surgery, you may be constipated. Take a laxative if necessary.   MEDICATION: You should resume your pre-surgery medications unless told not to. In addition you will often be given an antibiotic to prevent infection. These should be taken as prescribed until the bottles are finished unless   you are having an unusual reaction to one of the drugs.  PROBLEMS YOU SHOULD REPORT TO US: Fevers over 100.5 Fahrenheit. Heavy bleeding, or clots ( See above notes about blood in urine ). Inability to urinate. Drug reactions ( hives, rash, nausea, vomiting, diarrhea ). Severe burning or pain with urination that is not improving.  FOLLOW-UP: You will  need a follow-up appointment to monitor your progress. Call for this appointment at the number listed above. Usually the first appointment will be about three to fourteen days after your surgery.      

## 2013-08-05 NOTE — Anesthesia Postprocedure Evaluation (Signed)
Anesthesia Post Note  Patient: Misty Thompson  Procedure(s) Performed: Procedure(s) (LRB): CYSTOSCOPY WITH RETROGRADE PYELOGRAM, URETEROSCOPY AND STENT PLACEMENT (Right)  Anesthesia type: General  Patient location: PACU  Post pain: Pain level controlled  Post assessment: Post-op Vital signs reviewed  Last Vitals:  Filed Vitals:   08/05/13 0933  BP: 115/69  Pulse: 63  Temp: 36.3 C  Resp: 18    Post vital signs: Reviewed  Level of consciousness: sedated  Complications: No apparent anesthesia complications

## 2013-08-05 NOTE — Anesthesia Procedure Notes (Signed)
Procedure Name: LMA Insertion Date/Time: 08/05/2013 7:43 AM Performed by: Norva PavlovALLAWAY, ROBIN G Pre-anesthesia Checklist: Patient identified, Emergency Drugs available, Suction available and Patient being monitored Patient Re-evaluated:Patient Re-evaluated prior to inductionOxygen Delivery Method: Circle System Utilized Preoxygenation: Pre-oxygenation with 100% oxygen Intubation Type: IV induction Ventilation: Mask ventilation without difficulty LMA: LMA inserted LMA Size: 4.0 Number of attempts: 1 Airway Equipment and Method: bite block Placement Confirmation: positive ETCO2 Tube secured with: Tape Dental Injury: Teeth and Oropharynx as per pre-operative assessment

## 2013-08-08 ENCOUNTER — Encounter (HOSPITAL_BASED_OUTPATIENT_CLINIC_OR_DEPARTMENT_OTHER): Payer: Self-pay | Admitting: Urology

## 2017-01-15 ENCOUNTER — Encounter (HOSPITAL_COMMUNITY): Payer: Self-pay

## 2017-01-15 ENCOUNTER — Other Ambulatory Visit: Payer: Self-pay

## 2017-01-15 DIAGNOSIS — O26899 Other specified pregnancy related conditions, unspecified trimester: Secondary | ICD-10-CM | POA: Insufficient documentation

## 2017-01-15 DIAGNOSIS — Z3A00 Weeks of gestation of pregnancy not specified: Secondary | ICD-10-CM | POA: Diagnosis not present

## 2017-01-15 DIAGNOSIS — R350 Frequency of micturition: Secondary | ICD-10-CM | POA: Diagnosis not present

## 2017-01-15 DIAGNOSIS — R102 Pelvic and perineal pain: Secondary | ICD-10-CM | POA: Insufficient documentation

## 2017-01-15 DIAGNOSIS — R1030 Lower abdominal pain, unspecified: Secondary | ICD-10-CM | POA: Diagnosis present

## 2017-01-15 DIAGNOSIS — O23599 Infection of other part of genital tract in pregnancy, unspecified trimester: Secondary | ICD-10-CM | POA: Insufficient documentation

## 2017-01-15 LAB — URINALYSIS, ROUTINE W REFLEX MICROSCOPIC
BACTERIA UA: NONE SEEN
Bilirubin Urine: NEGATIVE
Glucose, UA: NEGATIVE mg/dL
Ketones, ur: NEGATIVE mg/dL
Leukocytes, UA: NEGATIVE
Nitrite: POSITIVE — AB
PH: 6 (ref 5.0–8.0)
Protein, ur: NEGATIVE mg/dL
SPECIFIC GRAVITY, URINE: 1.016 (ref 1.005–1.030)

## 2017-01-15 LAB — COMPREHENSIVE METABOLIC PANEL
ALT: 14 U/L (ref 14–54)
AST: 19 U/L (ref 15–41)
Albumin: 3.8 g/dL (ref 3.5–5.0)
Alkaline Phosphatase: 63 U/L (ref 38–126)
Anion gap: 6 (ref 5–15)
BUN: 11 mg/dL (ref 6–20)
CHLORIDE: 104 mmol/L (ref 101–111)
CO2: 24 mmol/L (ref 22–32)
Calcium: 9.5 mg/dL (ref 8.9–10.3)
Creatinine, Ser: 0.69 mg/dL (ref 0.44–1.00)
GFR calc Af Amer: >60 mL/min (ref >60)
GFR calc non Af Amer: >60 mL/min (ref >60)
GLUCOSE: 93 mg/dL (ref 65–99)
Potassium: 3.9 mmol/L (ref 3.5–5.1)
Sodium: 134 mmol/L — ABNORMAL LOW (ref 135–145)
Total Bilirubin: 0.1 mg/dL — ABNORMAL LOW (ref 0.3–1.2)
Total Protein: 7.2 g/dL (ref 6.5–8.1)

## 2017-01-15 LAB — CBC
HEMATOCRIT: 34.3 % — AB (ref 36.0–46.0)
Hemoglobin: 11.6 g/dL — ABNORMAL LOW (ref 12.0–15.0)
MCH: 29.8 pg (ref 26.0–34.0)
MCHC: 33.8 g/dL (ref 30.0–36.0)
MCV: 88.2 fL (ref 78.0–100.0)
Platelets: 313 10*3/uL (ref 150–400)
RBC: 3.89 MIL/uL (ref 3.87–5.11)
RDW: 13 % (ref 11.5–15.5)
WBC: 12.4 10*3/uL — ABNORMAL HIGH (ref 4.0–10.5)

## 2017-01-15 LAB — I-STAT BETA HCG BLOOD, ED (MC, WL, AP ONLY): I-stat hCG, quantitative: 163.6 m[IU]/mL — ABNORMAL HIGH (ref <5)

## 2017-01-15 LAB — LIPASE, BLOOD: LIPASE: 29 U/L (ref 11–51)

## 2017-01-15 NOTE — ED Triage Notes (Signed)
Pt endorses lower abd pain with urinary frequency and lower back pain since yesterday. VSS. Denies unprotected sex. Hx of kidney stone. Denies n/v/d.

## 2017-01-16 ENCOUNTER — Emergency Department (HOSPITAL_COMMUNITY): Payer: Medicaid Other

## 2017-01-16 ENCOUNTER — Emergency Department (HOSPITAL_COMMUNITY)
Admission: EM | Admit: 2017-01-16 | Discharge: 2017-01-16 | Disposition: A | Discharge status: Home / Self Care | Payer: Medicaid Other | Attending: Emergency Medicine | Admitting: Emergency Medicine

## 2017-01-16 ENCOUNTER — Other Ambulatory Visit (HOSPITAL_COMMUNITY): Payer: Self-pay

## 2017-01-16 DIAGNOSIS — R102 Pelvic and perineal pain: Secondary | ICD-10-CM

## 2017-01-16 DIAGNOSIS — O26899 Other specified pregnancy related conditions, unspecified trimester: Secondary | ICD-10-CM

## 2017-01-16 DIAGNOSIS — B9689 Other specified bacterial agents as the cause of diseases classified elsewhere: Secondary | ICD-10-CM

## 2017-01-16 DIAGNOSIS — N760 Acute vaginitis: Secondary | ICD-10-CM

## 2017-01-16 LAB — WET PREP, GENITAL
Sperm: NONE SEEN
Trich, Wet Prep: NONE SEEN
Yeast Wet Prep HPF POC: NONE SEEN

## 2017-01-16 LAB — HCG, QUANTITATIVE, PREGNANCY: hCG, Beta Chain, Quant, S: 173 m[IU]/mL — ABNORMAL HIGH (ref <5)

## 2017-01-16 LAB — GC/CHLAMYDIA PROBE AMP (~~LOC~~) NOT AT ARMC
Chlamydia: NEGATIVE
NEISSERIA GONORRHEA: NEGATIVE

## 2017-01-16 MED ORDER — METRONIDAZOLE 500 MG PO TABS: 500.0000 mg | ORAL_TABLET | Freq: Two times a day (BID) | ORAL | 0 refills | Status: DC

## 2017-01-16 MED ORDER — ACETAMINOPHEN 325 MG PO TABS
650.0000 mg | ORAL_TABLET | Freq: Once | ORAL | Status: AC
  Administered 2017-01-16: 650 mg via ORAL
  Filled 2017-01-16: qty 2

## 2017-01-16 MED ORDER — CEPHALEXIN 500 MG PO CAPS: 500.0000 mg | ORAL_CAPSULE | Freq: Four times a day (QID) | ORAL | 0 refills | Status: DC

## 2017-01-16 MED ORDER — PRENATAL COMPLETE 14-0.4 MG PO TABS: 1.0000 | ORAL_TABLET | Freq: Every day | ORAL | 0 refills | Status: DC

## 2017-01-16 NOTE — ED Notes (Signed)
Patient transported to Ultrasound 

## 2017-01-16 NOTE — ED Provider Notes (Signed)
[facility] Provider Note   CSN: [id number] Arrival date & time: 01/15/17  2106     History   Chief Complaint Chief Complaint  Patient presents with  . Abdominal Pain  . Urinary Frequency    HPI Misty Thompson is a 26 y.o. female.  Patient with history of kidney stone presenting with intermittent lower abdominal pain for the past 3 days.  Pain is worse in her midline and comes and goes waxing and waning in intensity.  Pain involves her low back as well.  Denies any dysuria hematuria.  Denies any abnormal vaginal bleeding or discharge.  Last menstrual cycle was beginning of November.  Denies any diarrhea or constipation.  Denies any chest pain or shortness of breath.  She is taking ibuprofen at home without relief.  Still has appendix and gallbladder.  Pain is not similar to previous kidney stone.   The history is provided by the patient.  Abdominal Pain   Associated symptoms include nausea, dysuria, frequency and hematuria. Pertinent negatives include fever, vomiting, constipation, headaches and myalgias.  Urinary Frequency  Associated symptoms include abdominal pain. Pertinent negatives include no chest pain, no headaches and no shortness of breath.    Past Medical History:  Diagnosis Date  . Right ureteral stone   . Wears contact lenses     There are no active problems to display for this patient.   Past Surgical History:  Procedure Laterality Date  . CYSTOSCOPY WITH RETROGRADE PYELOGRAM, URETEROSCOPY AND STENT PLACEMENT Right 08/05/2013   Procedure: CYSTOSCOPY WITH RETROGRADE PYELOGRAM, URETEROSCOPY AND STENT PLACEMENT;  Surgeon: Danae ChenMarc H Nesi, MD;  [facility] [place];  Service: Urology;  Laterality: Right;  . WISDOM TOOTH EXTRACTION  MARCH 2015    OB History    Gravida Para Term Preterm AB Living             0   SAB TAB Ectopic Multiple Live Births                   Home Medications    Prior to  Admission medications   Medication Sig Start Date End Date Taking? Authorizing Provider  HYDROcodone-acetaminophen (NORCO/VICODIN) 5-325 MG per tablet Take 1-2 tablets by mouth every 4 (four) hours as needed for moderate pain or severe pain. 07/31/13   Trixie DredgeWest, Emily, PA-C  ibuprofen (ADVIL,MOTRIN) 200 MG tablet Take 200 mg by mouth every 6 (six) hours as needed.    [provider]  medroxyPROGESTERone (DEPO-PROVERA) 150 MG/ML injection Inject 150 mg into the muscle every 3 (three) months. Last injection   April  2015    [provider]  phenazopyridine (PYRIDIUM) 200 MG tablet Take 1 tablet (200 mg total) by mouth 3 (three) times daily. 07/31/13   Arthor CaptainHarris, Abigail, PA-C  promethazine (PHENERGAN) 25 MG tablet Take 1 tablet (25 mg total) by mouth every 6 (six) hours as needed for nausea. 07/31/13   Trixie DredgeWest, Emily, PA-C    Family History History reviewed. No pertinent family history.  Social History Social History   Tobacco Use  . Smoking status: Never Smoker  . Smokeless tobacco: Never Used  Substance Use Topics  . Alcohol use: Yes    Comment: occ  . Drug use: No     Allergies   Patient has no known allergies.   Review of Systems Review of Systems  Constitutional: Negative for activity change, appetite change and fever.  HENT: Negative for congestion and rhinorrhea.   Respiratory:  Negative for cough, chest tightness and shortness of breath.   Cardiovascular: Negative for chest pain.  Gastrointestinal: Positive for abdominal pain and nausea. Negative for constipation and vomiting.  Genitourinary: Positive for dysuria, frequency and hematuria. Negative for vaginal bleeding and vaginal discharge.  Musculoskeletal: Positive for back pain. Negative for myalgias and neck pain.  Skin: Negative for rash.  Neurological: Negative for dizziness, weakness and headaches.    all other systems are negative except as noted in the HPI and PMH.    Physical Exam Updated Vital  Signs BP 133/68   Pulse 90   Temp 98.3 F (36.8 C) (Oral)   Resp 16   Ht 5\' 2"  (1.575 m)   Wt 81.6 kg (180 lb)   LMP  (LMP Unknown)   SpO2 98%   BMI 32.92 kg/m   Physical Exam  Constitutional: She is oriented to person, place, and time. She appears well-developed and well-nourished. No distress.  HENT:  Head: Normocephalic and atraumatic.  Mouth/Throat: Oropharynx is clear and moist. No oropharyngeal exudate.  Eyes: Conjunctivae and EOM are normal. Pupils are equal, round, and reactive to light.  Neck: Normal range of motion. Neck supple.  No meningismus.  Cardiovascular: Normal rate, regular rhythm, normal heart sounds and intact distal pulses.  No murmur heard. Pulmonary/Chest: Effort normal and breath sounds normal. No respiratory distress.  Abdominal: Soft. There is tenderness. There is no rebound and no guarding.  Mild suprapubic tenderness No RLQ tenderness No pain at McBurney's point  Genitourinary:  Genitourinary Comments: Chaperone present. Normal external genitalia.  White discharge in vaginal vault.  No CMT.  Midline uterus tenderness.  No lateralizing adnexal tenderness  Musculoskeletal: Normal range of motion. She exhibits no edema or tenderness.  No CVA tenderness  Neurological: She is alert and oriented to person, place, and time. No cranial nerve deficit. She exhibits normal muscle tone. Coordination normal.  No ataxia on finger to nose bilaterally. No pronator drift. 5/5 strength throughout. CN 2-12 intact.Equal grip strength. Sensation intact.   Skin: Skin is warm.  Psychiatric: She has a normal mood and affect. Her behavior is normal.  Nursing note and vitals reviewed.    ED Treatments / Results  Labs (all labs ordered are listed, but only abnormal results are displayed) Labs Reviewed  COMPREHENSIVE METABOLIC PANEL - Abnormal; Notable for the following components:      Result Value   Sodium 134 (*)    Total Bilirubin 0.1 (*)    All other components  within normal limits  CBC - Abnormal; Notable for the following components:   WBC 12.4 (*)    Hemoglobin 11.6 (*)    HCT 34.3 (*)    All other components within normal limits  URINALYSIS, ROUTINE W REFLEX MICROSCOPIC - Abnormal; Notable for the following components:   Color, Urine AMBER (*)    Hgb urine dipstick SMALL (*)    Nitrite POSITIVE (*)    Squamous Epithelial / LPF 0-5 (*)    All other components within normal limits  HCG, QUANTITATIVE, PREGNANCY - Abnormal; Notable for the following components:   hCG, Beta Chain, Quant, S 173 (*)    All other components within normal limits  I-STAT BETA HCG BLOOD, ED ([facility], [facility], [facility] ONLY) - Abnormal; Notable for the following components:   I-stat hCG, quantitative 163.6 (*)    All other components within normal limits  WET PREP, GENITAL  LIPASE, BLOOD  GC/CHLAMYDIA PROBE AMP ([place]) NOT AT [facility]    EKG  EKG Interpretation None       Radiology US Pelvis (transabdominal Only)  Result Date: 01/16/2017 CLINICAL DATA:  26 y/o  F; pelvic pain. EXAM: TRANSABDOMINAL ULTRASOUND OF PELVIS TECHNIQUE: Transabdominal ultrasound examination of the pelvis was performed including evaluation of the uterus, ovaries, adnexal regions, and pelvic cul-de-sac. COMPARISON:  None. FINDINGS: Uterus Measurements: 6.9 x 3.7 x 4.5 cm. No fibroids or other mass visualized. Endometrium Thickness: 8 mm.  No focal abnormality visualized. Right ovary Measurements: 2.3 x 1.4 x 1.1 cm. Normal appearance/no adnexal mass. Left ovary Measurements: 2.4 x 1.6 x 2.1 cm. Normal appearance/no adnexal mass. Adjacent bowel loop. Other findings:  No abnormal free fluid. IMPRESSION: Normal pelvic ultrasound. Electronically Signed   By: Mitzi Hansen M.D.   On: 01/16/2017 05:44   US Renal  Addendum Date: 01/16/2017   ADDENDUM REPORT: 01/16/2017 05:41 ADDENDUM: Please note the original report for this study belongs to a different study. Please refer to these addendum as the  official report for this renal ultrasound dated 01/16/2017. The right kidney demonstrates normal echogenicity and measures 10.7 cm in length. There is no hydronephrosis or echogenic stone. The left kidney appears unremarkable and measures 10.8 cm in length. No hydronephrosis or echogenic stone. The urinary bladder is grossly unremarkable for the degree of distention. IMPRESSION: Unremarkable renal ultrasound. Electronically Signed   By: Elgie Collard M.D.   On: 01/16/2017 05:41   Result Date: 01/16/2017 CLINICAL DATA:  Pelvic pain. EXAM: RENAL / URINARY TRACT ULTRASOUND COMPLETE COMPARISON:  Body CT 07/31/2013 FINDINGS: Right Kidney: Length: 11.2 cm. Echogenicity within normal limits. No mass or hydronephrosis visualized. Tiny echogenic foci within the right renal parenchyma may represent nonobstructive calculi. The largest such focus measures 5 mm and is located in the lower pole of the right kidney. Left Kidney: Not seen. Bladder: Appears normal for degree of bladder distention. Postvoid volume of 23.2 mL IMPRESSION: Probable right nephrolithiasis.  No evidence of hydronephrosis. Normal appearance of the urinary bladder. Electronically Signed: By: Ted Mcalpine M.D. On: 01/16/2017 04:21    Procedures Procedures (including critical care time)  Medications Ordered in ED Medications  acetaminophen (TYLENOL) tablet 650 mg (not administered)     Initial Impression / Assessment and Plan / ED Course  I have reviewed the triage vital signs and the nursing notes.  Pertinent labs & imaging results that were available during my care of the patient were reviewed by me and considered in my medical decision making (see chart for details).    Patient with lower abdominal pain and back pain and urinary frequency.  Found to be pregnant.  HCG is 139.  Discussed with patient that she is very early in her pregnancy.  Gestational sac not seen transabdominally.  She refuses intravaginal  ultrasound.  Discussed with patient that she could have early pregnancy or this could represent miscarriage or possible ectopic pregnancy.  She will need repeat ultrasound and beta hCG test in 10-14 days.  Urine culture sent as the UA is positive nitrites.  Ultrasound does not show pregnancy the patient refuses transvaginal ultrasound.  Discussed with patient she needs repeat ultrasound and beta hCG testing in 10-14 days.  Will treat for bacterial vaginosis and possibly urinary tract infection.  Patient is aware that this pregnancy is undetermined location at this time.  Follow-up with OB.  Return precautions discussed including worsening pain, vaginal bleeding or any other concerns.  Final Clinical Impressions(s) / ED Diagnoses   Final diagnoses:  Pelvic pain in pregnancy  Bacterial vaginosis    ED Discharge Orders    None       Avyn Aden, Jeannett SeniorStephen, MD 01/16/17 [phone]

## 2017-01-16 NOTE — ED Notes (Signed)
Pelvic cart at bedside. 

## 2017-01-16 NOTE — Discharge Instructions (Signed)
Your ultrasound does not show a pregnancy.  It is likely too early.  This pregnancy could be normal

## 2017-01-17 LAB — URINE CULTURE

## 2017-02-10 NOTE — L&D Delivery Note (Signed)
Delivery Note At 2:15 PM a viable female was delivered via Vaginal, Spontaneous (Presentation: OA).  APGAR: 7, 9; weight 6 lb 9.3 oz (2985 g).   Placenta status: Spontaneous and intact.  Cord: 3VC. Infant had decelerations with contractions/ pushing but rebounded well. Nuchal x1 delivered through.   Anesthesia:  Epidural Episiotomy: None Lacerations: None Suture Repair: na Est. Blood Loss (mL): 100  Mom to postpartum.  Baby to Couplet care / Skin to Skin.  Isa RankinKimberly Niles [facility] 09/30/2017, 3:36 PM

## 2017-03-06 ENCOUNTER — Encounter: Payer: Self-pay | Admitting: Certified Nurse Midwife

## 2017-03-23 ENCOUNTER — Ambulatory Visit: Payer: Medicaid Other

## 2017-03-23 ENCOUNTER — Other Ambulatory Visit (HOSPITAL_COMMUNITY)
Admission: RE | Admit: 2017-03-23 | Discharge: 2017-03-23 | Disposition: A | Discharge status: Home / Self Care | Payer: Medicaid Other | Source: Ambulatory Visit | Attending: Obstetrics & Gynecology | Admitting: Obstetrics & Gynecology

## 2017-03-23 ENCOUNTER — Encounter: Payer: Self-pay | Admitting: Obstetrics & Gynecology

## 2017-03-23 ENCOUNTER — Ambulatory Visit (INDEPENDENT_AMBULATORY_CARE_PROVIDER_SITE_OTHER): Payer: Medicaid Other | Admitting: Obstetrics & Gynecology

## 2017-03-23 VITALS — BP 125/74 | HR 77 | Wt 187.5 lb

## 2017-03-23 DIAGNOSIS — Z3491 Encounter for supervision of normal pregnancy, unspecified, first trimester: Secondary | ICD-10-CM | POA: Diagnosis not present

## 2017-03-23 DIAGNOSIS — Z3490 Encounter for supervision of normal pregnancy, unspecified, unspecified trimester: Secondary | ICD-10-CM | POA: Insufficient documentation

## 2017-03-23 DIAGNOSIS — O3680X0 Pregnancy with inconclusive fetal viability, not applicable or unspecified: Secondary | ICD-10-CM | POA: Diagnosis not present

## 2017-03-23 DIAGNOSIS — Z3689 Encounter for other specified antenatal screening: Secondary | ICD-10-CM

## 2017-03-23 NOTE — Progress Notes (Signed)
Subjective:   Misty Thompson is a 27 y.o. G1P0 at [redacted]w[redacted]d by LMP, early ultrasound being seen today for her first obstetrical visit. Pregnancy history fully reviewed.  Patient reports no complaints.  HISTORY: Obstetric History   G1   P0   T0   P0   A0   L0    SAB0   TAB0   Ectopic0   Multiple0   Live Births0     # Outcome Date GA Lbr Len/2nd Weight Sex Delivery Anes PTL Lv  1 Current             No cervical dysplasia history.  Past Medical History:  Diagnosis Date  . Right ureteral stone   . Wears contact lenses    Past Surgical History:  Procedure Laterality Date  . CYSTOSCOPY WITH RETROGRADE PYELOGRAM, URETEROSCOPY AND STENT PLACEMENT Right 08/05/2013   Procedure: CYSTOSCOPY WITH RETROGRADE PYELOGRAM, URETEROSCOPY AND STENT PLACEMENT;  Surgeon: Danae Chen, MD;  Location: [facility];  Service: Urology;  Laterality: Right;  . WISDOM TOOTH EXTRACTION  MARCH 2015   Family History  Problem Relation Age of Onset  . Hypertension Mother   . Diabetes Maternal Grandfather    Social History   Tobacco Use  . Smoking status: Never Smoker  . Smokeless tobacco: Never Used  Substance Use Topics  . Alcohol use: Yes    Comment: occ  . Drug use: No   No Known Allergies Current Outpatient Medications on File Prior to Visit  Medication Sig Dispense Refill  . Prenatal Vit-Fe Fumarate-FA (PRENATAL COMPLETE) 14-0.4 MG TABS Take 1 tablet by mouth daily. 60 each 0  . promethazine (PHENERGAN) 25 MG tablet Take 1 tablet (25 mg total) by mouth every 6 (six) hours as needed for nausea. 20 tablet 0   No current facility-administered medications on file prior to visit.     Review of Systems Pertinent items noted in HPI and remainder of comprehensive ROS otherwise negative.  Exam   Vitals:   03/23/17 1025  BP: 125/74  Pulse: 77  Weight: 187 lb 8 oz (85 kg)   Fetal Heart Rate (bpm): 162  Uterus:     Pelvic Exam: Perineum: no hemorrhoids, normal perineum    Vulva: normal external genitalia, no lesions   Vagina:  normal mucosa, normal discharge   Cervix: no lesions and normal, pap smear done.    Adnexa: normal adnexa and no mass, fullness, tenderness   Bony Pelvis: average  System: General: well-developed, well-nourished female in no acute distress   Breast:  normal appearance, no masses or tenderness   Skin: normal coloration and turgor, no rashes   Neurologic: oriented, normal, negative, normal mood   Extremities: normal strength, tone, and muscle mass, ROM of all joints is normal   HEENT PERRLA, extraocular movement intact and sclera clear, anicteric   Mouth/Teeth mucous membranes moist, pharynx normal without lesions and dental hygiene good   Neck supple and no masses   Cardiovascular: regular rate and rhythm   Respiratory:  no respiratory distress, normal breath sounds   Abdomen: soft, non-tender; bowel sounds normal; no masses,  no organomegaly     Assessment:   Pregnancy: G1P0 Patient Active Problem List   Diagnosis Date Noted  . Encounter for supervision of normal pregnancy, unspecified, unspecified trimester 03/23/2017     Plan:  1. Encounter to determine fetal viability of pregnancy, single or unspecified fetus - US OB Limited done today for heart rate check and  viability. Showed normal SIUP with FHR 162 bpm.   2. Encounter for fetal anatomic survey Anatomy scan ordered - US MFM OB COMP + 14 WK; Future  3. Encounter for supervision of normal pregnancy, antepartum, unspecified gravidity - Cytology - PAP - Hemoglobinopathy evaluation - Obstetric Panel, Including HIV - Culture, OB Urine - Genetic Screening - Varicella zoster antibody, IgG - SMN1 COPY NUMBER ANALYSIS (SMA Carrier Screen) - Cystic Fibrosis Mutation 97 - US MFM OB COMP + 14 WK; Future - Babyscripts Schedule Optimization - Enroll Patient in Babyscripts Initial labs drawn. Continue prenatal vitamins. Genetic Screening discussed, NIPS:  ordered. Ultrasound discussed; fetal anatomic survey: ordered. Offered Babyscripts, she enrolled in this today. Problem list reviewed and updated. The nature of [facility] with multiple MDs and other Advanced Practice Providers was explained to patient; also emphasized that residents, students are part of our team. Routine obstetric precautions reviewed. Return for OB 20 week visit (Babyscripts).     Jaynie CollinsUGONNA  Rodgers Likes, MD, FACOG Obstetrician & Gynecologist, [facility], [facility]

## 2017-03-23 NOTE — Patient Instructions (Signed)
Return to clinic for any scheduled appointments or obstetric concerns, or go to MAU for evaluation   Second Trimester of Pregnancy The second trimester is from week 14 through week 27 (months 4 through 6). The second trimester is often a time when you feel your best. Your body has adjusted to being pregnant, and you begin to feel better physically. Usually, morning sickness has lessened or quit completely, you may have more energy, and you may have an increase in appetite. The second trimester is also a time when the fetus is growing rapidly. At the end of the sixth month, the fetus is about 9 inches long and weighs about 1 pounds. You will likely begin to feel the baby move (quickening) between 16 and 20 weeks of pregnancy. Body changes during your second trimester Your body continues to go through many changes during your second trimester. The changes vary from woman to woman.  Your weight will continue to increase. You will notice your lower abdomen bulging out.  You may begin to get stretch marks on your hips, abdomen, and breasts.  You may develop headaches that can be relieved by medicines. The medicines should be approved by your health care provider.  You may urinate more often because the fetus is pressing on your bladder.  You may develop or continue to have heartburn as a result of your pregnancy.  You may develop constipation because certain hormones are causing the muscles that push waste through your intestines to slow down.  You may develop hemorrhoids or swollen, bulging veins (varicose veins).  You may have back pain. This is caused by: ? Weight gain. ? Pregnancy hormones that are relaxing the joints in your pelvis. ? A shift in weight and the muscles that support your balance.  Your breasts will continue to grow and they will continue to become tender.  Your gums may bleed and may be sensitive to brushing and flossing.  Dark spots or blotches (chloasma, mask of  pregnancy) may develop on your face. This will likely fade after the baby is born.  A dark line from your belly button to the pubic area (linea nigra) may appear. This will likely fade after the baby is born.  You may have changes in your hair. These can include thickening of your hair, rapid growth, and changes in texture. Some women also have hair loss during or after pregnancy, or hair that feels dry or thin. Your hair will most likely return to normal after your baby is born.  What to expect at prenatal visits During a routine prenatal visit:  You will be weighed to make sure you and the fetus are growing normally.  Your blood pressure will be taken.  Your abdomen will be measured to track your baby's growth.  The fetal heartbeat will be listened to.  Any test results from the previous visit will be discussed.  Your health care provider may ask you:  How you are feeling.  If you are feeling the baby move.  If you have had any abnormal symptoms, such as leaking fluid, bleeding, severe headaches, or abdominal cramping.  If you are using any tobacco products, including cigarettes, chewing tobacco, and electronic cigarettes.  If you have any questions.  Other tests that may be performed during your second trimester include:  Blood tests that check for: ? Low iron levels (anemia). ? High blood sugar that affects pregnant women (gestational diabetes) between 24 and 28 weeks. ? Rh antibodies. This is to check   for a protein on red blood cells (Rh factor).  Urine tests to check for infections, diabetes, or protein in the urine.  An ultrasound to confirm the proper growth and development of the baby.  An amniocentesis to check for possible genetic problems.  Fetal screens for spina bifida and Down syndrome.  HIV (human immunodeficiency virus) testing. Routine prenatal testing includes screening for HIV, unless you choose not to have this test.  Follow these instructions at  home: Medicines  Follow your health care provider's instructions regarding medicine use. Specific medicines may be either safe or unsafe to take during pregnancy.  Take a prenatal vitamin that contains at least 600 micrograms (mcg) of folic acid.  If you develop constipation, try taking a stool softener if your health care provider approves. Eating and drinking  Eat a balanced diet that includes fresh fruits and vegetables, whole grains, good sources of protein such as meat, eggs, or tofu, and low-fat dairy. Your health care provider will help you determine the amount of weight gain that is right for you.  Avoid raw meat and uncooked cheese. These carry germs that can cause birth defects in the baby.  If you have low calcium intake from food, talk to your health care provider about whether you should take a daily calcium supplement.  Limit foods that are high in fat and processed sugars, such as fried and sweet foods.  To prevent constipation: ? Drink enough fluid to keep your urine clear or pale yellow. ? Eat foods that are high in fiber, such as fresh fruits and vegetables, whole grains, and beans. Activity  Exercise only as directed by your health care provider. Most women can continue their usual exercise routine during pregnancy. Try to exercise for 30 minutes at least 5 days a week. Stop exercising if you experience uterine contractions.  Avoid heavy lifting, wear low heel shoes, and practice good posture.  A sexual relationship may be continued unless your health care provider directs you otherwise. Relieving pain and discomfort  Wear a good support bra to prevent discomfort from breast tenderness.  Take warm sitz baths to soothe any pain or discomfort caused by hemorrhoids. Use hemorrhoid cream if your health care provider approves.  Rest with your legs elevated if you have leg cramps or low back pain.  If you develop varicose veins, wear support hose. Elevate your feet  for 15 minutes, 3-4 times a day. Limit salt in your diet. Prenatal Care  Write down your questions. Take them to your prenatal visits.  Keep all your prenatal visits as told by your health care provider. This is important. Safety  Wear your seat belt at all times when driving.  Make a list of emergency phone numbers, including numbers for family, friends, the hospital, and police and fire departments. General instructions  Ask your health care provider for a referral to a local prenatal education class. Begin classes no later than the beginning of month 6 of your pregnancy.  Ask for help if you have counseling or nutritional needs during pregnancy. Your health care provider can offer advice or refer you to specialists for help with various needs.  Do not use hot tubs, steam rooms, or saunas.  Do not douche or use tampons or scented sanitary pads.  Do not cross your legs for long periods of time.  Avoid cat litter boxes and soil used by cats. These carry germs that can cause birth defects in the baby and possibly loss of the fetus   by miscarriage or stillbirth.  Avoid all smoking, herbs, alcohol, and unprescribed drugs. Chemicals in these products can affect the formation and growth of the baby.  Do not use any products that contain nicotine or tobacco, such as cigarettes and e-cigarettes. If you need help quitting, ask your health care provider.  Visit your dentist if you have not gone yet during your pregnancy. Use a soft toothbrush to brush your teeth and be gentle when you floss. Contact a health care provider if:  You have dizziness.  You have mild pelvic cramps, pelvic pressure, or nagging pain in the abdominal area.  You have persistent nausea, vomiting, or diarrhea.  You have a bad smelling vaginal discharge.  You have pain when you urinate. Get help right away if:  You have a fever.  You are leaking fluid from your vagina.  You have spotting or bleeding from your  vagina.  You have severe abdominal cramping or pain.  You have rapid weight gain or weight loss.  You have shortness of breath with chest pain.  You notice sudden or extreme swelling of your face, hands, ankles, feet, or legs.  You have not felt your baby move in over an hour.  You have severe headaches that do not go away when you take medicine.  You have vision changes. Summary  The second trimester is from week 14 through week 27 (months 4 through 6). It is also a time when the fetus is growing rapidly.  Your body goes through many changes during pregnancy. The changes vary from woman to woman.  Avoid all smoking, herbs, alcohol, and unprescribed drugs. These chemicals affect the formation and growth your baby.  Do not use any tobacco products, such as cigarettes, chewing tobacco, and e-cigarettes. If you need help quitting, ask your health care provider.  Contact your health care provider if you have any questions. Keep all prenatal visits as told by your health care provider. This is important. This information is not intended to replace advice given to you by your health care provider. Make sure you discuss any questions you have with your health care provider. Document Released: 01/21/2001 Document Revised: 03/04/2016 Document Reviewed: 03/04/2016 Elsevier Interactive Patient Education  2018 Elsevier Inc.  

## 2017-03-24 LAB — CYTOLOGY - PAP: DIAGNOSIS: NEGATIVE

## 2017-03-25 LAB — OBSTETRIC PANEL, INCLUDING HIV
Antibody Screen: NEGATIVE
BASOS ABS: 0 10*3/uL (ref 0.0–0.2)
Basos: 0 %
EOS (ABSOLUTE): 0.1 10*3/uL (ref 0.0–0.4)
Eos: 1 %
HEP B S AG: NEGATIVE
HIV SCREEN 4TH GENERATION: NONREACTIVE
Hematocrit: 35.3 % (ref 34.0–46.6)
Hemoglobin: 11.7 g/dL (ref 11.1–15.9)
IMMATURE GRANULOCYTES: 0 %
Immature Grans (Abs): 0 10*3/uL (ref 0.0–0.1)
Lymphocytes Absolute: 3 10*3/uL (ref 0.7–3.1)
Lymphs: 30 %
MCH: 29.3 pg (ref 26.6–33.0)
MCHC: 33.1 g/dL (ref 31.5–35.7)
MCV: 89 fL (ref 79–97)
Monocytes Absolute: 0.7 10*3/uL (ref 0.1–0.9)
Monocytes: 7 %
NEUTROS ABS: 6 10*3/uL (ref 1.4–7.0)
NEUTROS PCT: 62 %
PLATELETS: 308 10*3/uL (ref 150–379)
RBC: 3.99 x10E6/uL (ref 3.77–5.28)
RDW: 13.8 % (ref 12.3–15.4)
RPR: NONREACTIVE
RUBELLA: 3.52 {index} (ref >0.99)
Rh Factor: POSITIVE
WBC: 9.7 10*3/uL (ref 3.4–10.8)

## 2017-03-25 LAB — CULTURE, OB URINE

## 2017-03-25 LAB — HEMOGLOBINOPATHY EVALUATION
HGB A: 97.5 % (ref 96.4–98.8)
HGB C: 0 %
HGB S: 0 %
HGB VARIANT: 0 %
Hemoglobin A2 Quantitation: 2.5 % (ref 1.8–3.2)
Hemoglobin F Quantitation: 0 % (ref 0.0–2.0)

## 2017-03-25 LAB — VARICELLA ZOSTER ANTIBODY, IGG: Varicella zoster IgG: 2014 index (ref >165)

## 2017-03-25 LAB — URINE CULTURE, OB REFLEX

## 2017-03-31 ENCOUNTER — Other Ambulatory Visit (HOSPITAL_COMMUNITY): Payer: Medicaid Other

## 2017-03-31 LAB — CYSTIC FIBROSIS MUTATION 97: Interpretation: NOT DETECTED

## 2017-04-01 LAB — SMN1 COPY NUMBER ANALYSIS (SMA CARRIER SCREENING)

## 2017-04-20 ENCOUNTER — Ambulatory Visit (INDEPENDENT_AMBULATORY_CARE_PROVIDER_SITE_OTHER): Payer: Medicaid Other | Admitting: Obstetrics and Gynecology

## 2017-04-20 ENCOUNTER — Encounter: Payer: Self-pay | Admitting: Obstetrics and Gynecology

## 2017-04-20 DIAGNOSIS — Z3400 Encounter for supervision of normal first pregnancy, unspecified trimester: Secondary | ICD-10-CM

## 2017-04-20 DIAGNOSIS — Z3402 Encounter for supervision of normal first pregnancy, second trimester: Secondary | ICD-10-CM

## 2017-04-20 NOTE — Progress Notes (Signed)
   PRENATAL VISIT NOTE  Subjective:  Antony Hastealiyah M Corp is a 27 y.o. G1P0 at 6541w5d being seen today for ongoing prenatal care.  She is currently monitored for the following issues for this low-risk pregnancy and has Encounter for supervision of normal pregnancy, unspecified, unspecified trimester on their problem list.  Patient reports no complaints.  Contractions: Not present. Vag. Bleeding: None.  Movement: Present. Denies leaking of fluid.   The following portions of the patient's history were reviewed and updated as appropriate: allergies, current medications, past family history, past medical history, past social history, past surgical history and problem list. Problem list updated.  Objective:   Vitals:   04/20/17 1513  BP: 122/79  Pulse: (!) 130  Weight: 194 lb 6.4 oz (88.2 kg)    Fetal Status: Fetal Heart Rate (bpm): 150   Movement: Present     General:  Alert, oriented and cooperative. Patient is in no acute distress.  Skin: Skin is warm and dry. No rash noted.   Cardiovascular: Normal heart rate noted  Respiratory: Normal respiratory effort, no problems with respiration noted  Abdomen: Soft, gravid, appropriate for gestational age.  Pain/Pressure: Present     Pelvic: Cervical exam deferred        Extremities: Normal range of motion.  Edema: None  Mental Status:  Normal mood and affect. Normal behavior. Normal judgment and thought content.   Assessment and Plan:  Pregnancy: G1P0 at 3741w5d  1. Supervision of normal first pregnancy, antepartum Patient is doing well without complaints Patient declined AFP only today. She may return prior to her next appointment for lab only visit Anatomy ultrasound scheduled Patient declined BRx  Preterm labor symptoms and general obstetric precautions including but not limited to vaginal bleeding, contractions, leaking of fluid and fetal movement were reviewed in detail with the patient. Please refer to After Visit Summary for other  counseling recommendations.  Return in about 4 weeks (around 05/18/2017) for ROB.   Catalina AntiguaPeggy Romano Stigger, MD

## 2017-04-21 ENCOUNTER — Encounter (HOSPITAL_COMMUNITY): Payer: Self-pay | Admitting: Obstetrics & Gynecology

## 2017-04-28 ENCOUNTER — Ambulatory Visit (HOSPITAL_COMMUNITY)
Admission: RE | Admit: 2017-04-28 | Discharge: 2017-04-28 | Disposition: A | Discharge status: Home / Self Care | Payer: Medicaid Other | Source: Ambulatory Visit | Attending: Obstetrics & Gynecology | Admitting: Obstetrics & Gynecology

## 2017-04-28 ENCOUNTER — Other Ambulatory Visit: Payer: Self-pay | Admitting: Obstetrics & Gynecology

## 2017-04-28 DIAGNOSIS — E669 Obesity, unspecified: Secondary | ICD-10-CM | POA: Insufficient documentation

## 2017-04-28 DIAGNOSIS — O99212 Obesity complicating pregnancy, second trimester: Secondary | ICD-10-CM | POA: Diagnosis present

## 2017-04-28 DIAGNOSIS — Z3A18 18 weeks gestation of pregnancy: Secondary | ICD-10-CM | POA: Insufficient documentation

## 2017-04-28 DIAGNOSIS — Z3490 Encounter for supervision of normal pregnancy, unspecified, unspecified trimester: Secondary | ICD-10-CM

## 2017-04-28 DIAGNOSIS — Z3689 Encounter for other specified antenatal screening: Secondary | ICD-10-CM | POA: Diagnosis present

## 2017-05-18 ENCOUNTER — Ambulatory Visit (INDEPENDENT_AMBULATORY_CARE_PROVIDER_SITE_OTHER): Payer: Medicaid Other | Admitting: Certified Nurse Midwife

## 2017-05-18 VITALS — BP 124/73 | HR 80 | Wt 193.0 lb

## 2017-05-18 DIAGNOSIS — Z3400 Encounter for supervision of normal first pregnancy, unspecified trimester: Secondary | ICD-10-CM

## 2017-05-18 DIAGNOSIS — Z3402 Encounter for supervision of normal first pregnancy, second trimester: Secondary | ICD-10-CM

## 2017-05-18 NOTE — Progress Notes (Signed)
   PRENATAL VISIT NOTE  Subjective:  Antony Hastealiyah M Gaal is a 27 y.o. G1P0 at 7924w5d being seen today for ongoing prenatal care.  She is currently monitored for the following issues for this low-risk pregnancy and has Encounter for supervision of normal pregnancy, unspecified, unspecified trimester on their problem list.  Patient reports no complaints.  Contractions: Not present. Vag. Bleeding: None.  Movement: Present. Denies leaking of fluid.   The following portions of the patient's history were reviewed and updated as appropriate: allergies, current medications, past family history, past medical history, past social history, past surgical history and problem list. Problem list updated.  Objective:   Vitals:   05/18/17 1425  BP: 124/73  Pulse: 80  Weight: 193 lb (87.5 kg)    Fetal Status: Fetal Heart Rate (bpm): 155; doppler Fundal Height: 22 cm Movement: Present     General:  Alert, oriented and cooperative. Patient is in no acute distress.  Skin: Skin is warm and dry. No rash noted.   Cardiovascular: Normal heart rate noted  Respiratory: Normal respiratory effort, no problems with respiration noted  Abdomen: Soft, gravid, appropriate for gestational age.  Pain/Pressure: Absent     Pelvic: Cervical exam deferred        Extremities: Normal range of motion.     Mental Status: Normal mood and affect. Normal behavior. Normal judgment and thought content.   Assessment and Plan:  Pregnancy: G1P0 at 7824w5d  1. Supervision of normal first pregnancy, antepartum     Doing well.    Preterm labor symptoms and general obstetric precautions including but not limited to vaginal bleeding, contractions, leaking of fluid and fetal movement were reviewed in detail with the patient. Please refer to After Visit Summary for other counseling recommendations.  Return in about 1 month (around 06/15/2017) for ROB.  No future appointments.  Roe Coombsachelle A Javeah Loeza, CNM

## 2017-06-15 ENCOUNTER — Other Ambulatory Visit: Payer: Self-pay

## 2017-06-15 ENCOUNTER — Ambulatory Visit (INDEPENDENT_AMBULATORY_CARE_PROVIDER_SITE_OTHER): Payer: Medicaid Other | Admitting: Certified Nurse Midwife

## 2017-06-15 ENCOUNTER — Encounter: Payer: Self-pay | Admitting: Certified Nurse Midwife

## 2017-06-15 VITALS — BP 133/72 | HR 94 | Wt 198.5 lb

## 2017-06-15 DIAGNOSIS — Z3400 Encounter for supervision of normal first pregnancy, unspecified trimester: Secondary | ICD-10-CM

## 2017-06-15 NOTE — Progress Notes (Signed)
   PRENATAL VISIT NOTE  Subjective:  Misty Thompson is a 27 y.o. G1P0 at [redacted]w[redacted]d being seen today for ongoing prenatal care.  She is currently monitored for the following issues for this low-risk pregnancy and has Encounter for supervision of normal pregnancy, unspecified, unspecified trimester on their problem list.  Patient reports no complaints.  Contractions: Irritability.  .  Movement: Present. Denies leaking of fluid.   The following portions of the patient's history were reviewed and updated as appropriate: allergies, current medications, past family history, past medical history, past social history, past surgical history and problem list. Problem list updated.  Objective:   Vitals:   06/15/17 1447  BP: 133/72  Pulse: 94  Weight: 198 lb 8 oz (90 kg)    Fetal Status: Fetal Heart Rate (bpm): 152; doppler Fundal Height: 25 cm Movement: Present     General:  Alert, oriented and cooperative. Patient is in no acute distress.  Skin: Skin is warm and dry. No rash noted.   Cardiovascular: Normal heart rate noted  Respiratory: Normal respiratory effort, no problems with respiration noted  Abdomen: Soft, gravid, appropriate for gestational age.  Pain/Pressure: Present     Pelvic: Cervical exam deferred        Extremities: Normal range of motion.  Edema: None  Mental Status: Normal mood and affect. Normal behavior. Normal judgment and thought content.   Assessment and Plan:  Pregnancy: G1P0 at [redacted]w[redacted]d  1. Supervision of normal first pregnancy, antepartum      Doing well.    Preterm labor symptoms and general obstetric precautions including but not limited to vaginal bleeding, contractions, leaking of fluid and fetal movement were reviewed in detail with the patient. Please refer to After Visit Summary for other counseling recommendations.  Return in about 2 weeks (around 06/29/2017) for ROB, 2 hr OGTT.  No future appointments.  Roe Coombs, CNM

## 2017-06-15 NOTE — Patient Instructions (Signed)
AREA PEDIATRIC/FAMILY PRACTICE PHYSICIANS  [facility] [address], [address] [place], [place]  [address] Phone - [phone]   Fax - [phone]  [facility] [address] [place], [place] [address] Phone - [phone]   Fax - [phone]  [facility] [address] [place], [place]  [address] Phone - [phone]   Fax - [phone]  [facility] [address] [place], [place]  [address] Phone - [phone]   Fax - [phone]  [facility] [address] [place], [place]  [address] Phone - [phone]   Fax - [phone]  [facility] [address] [place], [place]  [address] Phone - [phone]   Fax - [phone]  [facility] [address] [place], [place]  [address] Phone - [phone]   Fax - [phone]  [facility] [address] [place], [place]  [address] Phone - [phone]   Fax - [phone]  [facility] [address] [place], [place]  [address] Phone - [phone]   Fax - [phone] [facility] [address] [place], [place]  [address] Phone - [phone]   Fax - [phone]  [facility] [address] [place], [place]  [address] Phone - [phone]   Fax - [phone]  [facility] [address] [place], [place]  [address] Phone - [phone]   Fax - [phone]  [facility] [address] [place], [place]  [address] Phone - [phone]   Fax - [phone]  [facility] [address] [place], [place]  [address] Phone - [phone]  [facility] [address] [place], [place]  [address] Phone - [phone]   Fax - [phone]  [facility] [address] [place], [place]  [address] Phone - [phone]   Fax - [phone]  [facility] [address] [place], [place]  [address] Phone - [phone]   Fax - [phone]  [facility] [address] [place], [place]  [address] Phone - [phone]   Fax - [phone]   [facility] [address] [place], [place]  [address] Phone - [phone]   Fax - [phone]  [facility] [address] [place], [place]  [address] Phone - [phone]   Fax - [phone]  [facility] [address] [place], [place]  [address] Phone - [phone]   Fax - [phone]  [facility] [address] [place], [place]  [address] Phone - [phone]   Fax - [phone]  [facility] [address] , [place]  [address] Phone - [phone]   Fax - [phone] [facility] [address] [place], [place]  [address] Phone - [phone]   Fax - [phone]  [facility] [address] [place], [place]  [address] Phone - [phone]   Fax - [phone]  [facility] [address] [place],   [address] Phone - [phone]   Fax - [phone]  [facility] Charlene Flemming MD [address] [place]  [address] Phone [phone]  Fax [phone]   

## 2017-06-29 ENCOUNTER — Ambulatory Visit (INDEPENDENT_AMBULATORY_CARE_PROVIDER_SITE_OTHER): Payer: Medicaid Other | Admitting: Certified Nurse Midwife

## 2017-06-29 ENCOUNTER — Encounter: Payer: Self-pay | Admitting: Certified Nurse Midwife

## 2017-06-29 ENCOUNTER — Other Ambulatory Visit: Payer: Medicaid Other

## 2017-06-29 VITALS — BP 132/82 | HR 89

## 2017-06-29 DIAGNOSIS — Z3400 Encounter for supervision of normal first pregnancy, unspecified trimester: Secondary | ICD-10-CM

## 2017-06-29 DIAGNOSIS — Z3402 Encounter for supervision of normal first pregnancy, second trimester: Secondary | ICD-10-CM

## 2017-06-29 NOTE — Progress Notes (Signed)
   PRENATAL VISIT NOTE  Subjective:  Misty Thompson is a 27 y.o. G1P0 at [redacted]w[redacted]d being seen today for ongoing prenatal care.  She is currently monitored for the following issues for this low-risk pregnancy and has Encounter for supervision of normal pregnancy, unspecified, unspecified trimester on their problem list.  Patient reports no complaints.  Contractions: Not present. Vag. Bleeding: None.  Movement: Present. Denies leaking of fluid.   The following portions of the patient's history were reviewed and updated as appropriate: allergies, current medications, past family history, past medical history, past social history, past surgical history and problem list. Problem list updated.  Objective:   Vitals:   06/29/17 0952  BP: 132/82  Pulse: 89    Fetal Status: Fetal Heart Rate (bpm): 154; doppler Fundal Height: 28 cm Movement: Present     General:  Alert, oriented and cooperative. Patient is in no acute distress.  Skin: Skin is warm and dry. No rash noted.   Cardiovascular: Normal heart rate noted  Respiratory: Normal respiratory effort, no problems with respiration noted  Abdomen: Soft, gravid, appropriate for gestational age.  Pain/Pressure: Absent     Pelvic: Cervical exam deferred        Extremities: Normal range of motion.     Mental Status: Normal mood and affect. Normal behavior. Normal judgment and thought content.   Assessment and Plan:  Pregnancy: G1P0 at [redacted]w[redacted]d  1. Supervision of normal first pregnancy, antepartum     Doing well.   - Glucose Tolerance, 2 Hours w/1 Hour - CBC - HIV antibody - RPR  Preterm labor symptoms and general obstetric precautions including but not limited to vaginal bleeding, contractions, leaking of fluid and fetal movement were reviewed in detail with the patient. Please refer to After Visit Summary for other counseling recommendations.  Return in about 2 weeks (around 07/13/2017) for ROB.  No future appointments.  Roe Coombs,  CNM

## 2017-06-29 NOTE — Patient Instructions (Signed)
Before [facility] Before your baby arrives it is important to:  Have all of the supplies that you will need to care for your baby.  Know where to go if there is an emergency.  Discuss the baby's arrival with other family members.  What supplies will I need?  It is recommended that you have the following supplies: Large Items  Crib.  Crib mattress.  Rear-facing infant car seat. If possible, have a trained professional check to make sure that it is installed correctly.  Feeding  6-8 bottles that are 4-5 oz in size.  6-8 nipples.  Bottle brush.  Sterilizer, or a large pan or kettle with a lid.  A way to boil and cool water.  If you will be breastfeeding: ? Breast pump. ? Nipple cream. ? Nursing bra. ? Breast pads. ? Breast shields.  If you will be formula feeding: ? Formula. ? Measuring cups. ? Measuring spoons.  Bathing  Mild baby soap and baby shampoo.  Petroleum jelly.  Soft cloth towel and washcloth.  Hooded towel.  Cotton balls.  Bath basin.  Other Supplies  Rectal thermometer.  Bulb syringe.  Baby wipes or washcloths for diaper changes.  Diaper bag.  Changing pad.  Clothing, including one-piece outfits and pajamas.  Baby nail clippers.  Receiving blankets.  Mattress pad and sheets for the crib.  Night-light for the baby's room.  Baby monitor.  2 or 3 pacifiers.  Either 24-36 cloth diapers and waterproof diaper covers or a box of disposable diapers. You may need to use as many as 10-12 diapers per day.  How do I prepare for an emergency? Prepare for an emergency by:  Knowing how to get to the nearest hospital.  Listing the phone numbers of your baby's health care providers near your home phone and in your cell phone.  How do I prepare my family?  Decide how to handle visitors.  If you have other children: ? Talk with them about the baby coming home. Ask them how they feel about it. ? Read a book together about  being a new big brother or sister. ? Find ways to let them help you prepare for the new baby. ? Have someone ready to care for them while you are in the hospital. This information is not intended to replace advice given to you by your health care provider. Make sure you discuss any questions you have with your health care provider. Document Released: 01/10/2008 Document Revised: 07/05/2015 Document Reviewed: 01/04/2014 Elsevier Interactive Patient Education  2018 [employer].  [employer] of the uterus can occur throughout pregnancy, but they are not always a sign that you are in labor. You may have practice contractions called Braxton Hicks contractions. These false labor contractions are sometimes confused with true labor. What are [place] contractions? Braxton Hicks contractions are tightening movements that occur in the muscles of the uterus before labor. Unlike true labor contractions, these contractions do not result in opening (dilation) and thinning of the cervix. Toward the end of pregnancy (32-34 weeks), Braxton Hicks contractions can happen more often and may become stronger. These contractions are sometimes difficult to tell apart from true labor because they can be very uncomfortable. You should not feel embarrassed if you go to the hospital with false labor. Sometimes, the only way to tell if you are in true labor is for your health care provider to look for changes in the cervix. The health care provider will do a  physical exam and may monitor your contractions. If you are not in true labor, the exam should show that your cervix is not dilating and your water has not broken. If there are other health problems associated with your pregnancy, it is completely safe for you to be sent home with false labor. You may continue to have Braxton Hicks contractions until you go into true labor. How to tell the difference between true labor and false labor True  labor  Contractions last 30-70 seconds.  Contractions become very regular.  Discomfort is usually felt in the top of the uterus, and it spreads to the lower abdomen and low back.  Contractions do not go away with walking.  Contractions usually become more intense and increase in frequency.  The cervix dilates and gets thinner. False labor  Contractions are usually shorter and not as strong as true labor contractions.  Contractions are usually irregular.  Contractions are often felt in the front of the lower abdomen and in the groin.  Contractions may go away when you walk around or change positions while lying down.  Contractions get weaker and are shorter-lasting as time goes on.  The cervix usually does not dilate or become thin. Follow these instructions at home:  Take over-the-counter and prescription medicines only as told by your health care provider.  Keep up with your usual exercises and follow other instructions from your health care provider.  Eat and drink lightly if you think you are going into labor.  If Braxton Hicks contractions are making you uncomfortable: ? Change your position from lying down or resting to walking, or change from walking to resting. ? Sit and rest in a tub of warm water. ? Drink enough fluid to keep your urine pale yellow. Dehydration may cause these contractions. ? Do slow and deep breathing several times an hour.  Keep all follow-up prenatal visits as told by your health care provider. This is important. Contact a health care provider if:  You have a fever.  You have continuous pain in your abdomen. Get help right away if:  Your contractions become stronger, more regular, and closer together.  You have fluid leaking or gushing from your vagina.  You pass blood-tinged mucus (bloody show).  You have bleeding from your vagina.  You have low back pain that you never had before.  You feel your baby's head pushing down and  causing pelvic pressure.  Your baby is not moving inside you as much as it used to. Summary  Contractions that occur before labor are called Braxton Hicks contractions, false labor, or practice contractions.  Braxton Hicks contractions are usually shorter, weaker, farther apart, and less regular than true labor contractions. True labor contractions usually become progressively stronger and regular and they become more frequent.  Manage discomfort from Braxton Hicks contractions by changing position, resting in a warm bath, drinking plenty of water, or practicing deep breathing. This information is not intended to replace advice given to you by your health care provider. Make sure you discuss any questions you have with your health care provider. Document Released: 06/12/2016 Document Revised: 06/12/2016 Document Reviewed: 06/12/2016 Elsevier Interactive Patient Education  2018 Elsevier Inc.  Third Trimester of Pregnancy The third trimester is from week 29 through week 42, months 7 through 9. This trimester is when your unborn baby (fetus) is growing very fast. At the end of the ninth month, the unborn baby is about 20 inches in length. It weighs about 6-10 pounds. Follow   these instructions at home:  Avoid all smoking, herbs, and alcohol. Avoid drugs not approved by your doctor.  Do not use any tobacco products, including cigarettes, chewing tobacco, and electronic cigarettes. If you need help quitting, ask your doctor. You may get counseling or other support to help you quit.  Only take medicine as told by your doctor. Some medicines are safe and some are not during pregnancy.  Exercise only as told by your doctor. Stop exercising if you start having cramps.  Eat regular, healthy meals.  Wear a good support bra if your breasts are tender.  Do not use hot tubs, steam rooms, or saunas.  Wear your seat belt when driving.  Avoid raw meat, uncooked cheese, and liter boxes and soil used  by cats.  Take your prenatal vitamins.  Take 1500-2000 milligrams of calcium daily starting at the 20th week of pregnancy until you deliver your baby.  Try taking medicine that helps you poop (stool softener) as needed, and if your doctor approves. Eat more fiber by eating fresh fruit, vegetables, and whole grains. Drink enough fluids to keep your pee (urine) clear or pale yellow.  Take warm water baths (sitz baths) to soothe pain or discomfort caused by hemorrhoids. Use hemorrhoid cream if your doctor approves.  If you have puffy, bulging veins (varicose veins), wear support hose. Raise (elevate) your feet for 15 minutes, 3-4 times a day. Limit salt in your diet.  Avoid heavy lifting, wear low heels, and sit up straight.  Rest with your legs raised if you have leg cramps or low back pain.  Visit your dentist if you have not gone during your pregnancy. Use a soft toothbrush to brush your teeth. Be gentle when you floss.  You can have sex (intercourse) unless your doctor tells you not to.  Do not travel far distances unless you must. Only do so with your doctor's approval.  Take prenatal classes.  Practice driving to the hospital.  Pack your hospital bag.  Prepare the baby's room.  Go to your doctor visits. Get help if:  You are not sure if you are in labor or if your water has broken.  You are dizzy.  You have mild cramps or pressure in your lower belly (abdominal).  You have a nagging pain in your belly area.  You continue to feel sick to your stomach (nauseous), throw up (vomit), or have watery poop (diarrhea).  You have bad smelling fluid coming from your vagina.  You have pain with peeing (urination). Get help right away if:  You have a fever.  You are leaking fluid from your vagina.  You are spotting or bleeding from your vagina.  You have severe belly cramping or pain.  You lose or gain weight rapidly.  You have trouble catching your breath and have  chest pain.  You notice sudden or extreme puffiness (swelling) of your face, hands, ankles, feet, or legs.  You have not felt the baby move in over an hour.  You have severe headaches that do not go away with medicine.  You have vision changes. This information is not intended to replace advice given to you by your health care provider. Make sure you discuss any questions you have with your health care provider. Document Released: 04/23/2009 Document Revised: 07/05/2015 Document Reviewed: 03/30/2012 Elsevier Interactive Patient Education  2017 [employer].  Pregnancy, The Father's Role A father has an important role during his partner's pregnancy, labor, delivery, and after the birth of  the baby. It is important to help and support your partner through this new period. There are many physical and emotional changes that happen. To be helpful and supportive during this time, you should know and understand what is happening to your partner during pregnancy, labor, delivery, and after the baby is born. What are the stages of pregnancy? Pregnancy usually lasts about 40 weeks. The pregnancy is divided into three trimesters. First Trimester During the first 13 weeks, your partner may:  Feel tired.  Have painful breasts.  Feel nauseous or throw up.  Urinate more often.  Have mood changes.  All of these changes are normal. If they are happening, try to be helpful, supportive, and understanding. This may include helping with household duties and activities and spending more time with each other. Second Trimester During the next 14-28 weeks:  Your partner will likely feel better and more energetic.  This is the best time of the pregnancy to be more active together.  You will be able to see her belly showing the pregnancy.  You may be able to feel the baby kick.  Your partner may have soreness or aching in her back as she gains weight. You can help her by carrying heavy things and by  rubbing her back when she is feeling sore.  Third Trimester During the final 12 weeks, your partner may:  Become more uncomfortable as the baby grows.  Have a hard time doing everyday activities, and her balance may be off.  Have a hard time bending over.  Tire easily.  Have difficulty sleeping.  At this time, the birth of your baby is close. You and your partner may have concerns or questions. This is normal. Talk with each other and with your health care provider. Continue to help your partner with housework, encourage her to rest, and rub her sore back and legs, if this helps her. What can I expect or do during the pregnancy? You can expect to experience some changes. There are also many things you can do to help prepare you and your partner for your baby. Emotional Changes During your partner's pregnancy, emotional changes for you may include:  Having feelings of happiness, excitement, and pride.  Being concerned about having new responsibilities, such as financial or educational responsibilities.  Feeling overwhelmed or scared.  Being worried that a baby will change your relationship with your partner.  These feelings are normal. Talk about them openly with your partner and your health care provider. Prenatal Care Attend prenatal care visits with your partner. This is a good time for you to get to know your health care provider, follow the pregnancy, and ask questions.  Prenatal visits usually occur one time each month for six months, then every two weeks for two months, and then one time each week during the last month. You may have more prenatal visits if your health care provider believes this is needed.  Your health care provider usually does an ultrasound of the baby at one of the prenatal visits. This may happen more often if your health care provider thinks it is needed.  Sexual Activity Sexual intercourse is safe unless there is a problem with the pregnancy and your  health care provider advises you to not have sexual intercourse. Because physical and emotional changes happen in pregnancy, your partner may not want to have sex during certain times. Trying different positions may make sexual intercourse more comfortable. However, always respect your partner's decision if she does not  want to have sex. It is important for both of you to discuss your feelings and desires. Talk with your health care provider about any questions that you may have about sexual intercourse during pregnancy. Childbirth Classes Attend childbirth classes with your partner if you are able. Classes prepare you and help you to understand what happens during labor and delivery, and they help you and your partner to bond. There are even some classes that are only for new fathers. Classes also teach you and your partner:  Various relaxation techniques.  How to work with her labor pains.  How to focus during labor and delivery.  What should I know about labor and delivery? Many fathers want to be present while their partner is going through labor and delivery. You may:  Be asked to time the contractions, massage your partner's back, and breathe with her during the contractions.  Get to see and enjoy the excitement of your baby being born, and you may even be able to cut your baby's umbilical cord. If you feel like you might faint or you are uncomfortable, ask someone to help you.  Need to leave the room if a problem develops during labor or delivery.  A cesarean delivery, or C-section, is a procedure that may be used to deliver the baby. It is done through an incision in the abdomen and the uterus. A cesarean delivery may be scheduled or it may be an emergency procedure during labor and delivery. Most hospitals allow the father to be in the room for a cesarean delivery unless it is an emergency. Recovery from a cesarean delivery usually requires more help from the father. What happens after  delivery? After your baby is born, your partner will go through many changes again. These changes could last a few months or longer. Postpartum Depression Your partner may take awhile to regain her strength. She may also have feelings of sadness (postpartum blues or postpartum depression). If your partner is acting unusually sad or depressed, talk with your health care provider right away. This can be a serious medical condition that requires treatment. Breastfeeding Your partner may decide to breastfeed the baby. This helps with bonding between the mother and the baby, and breast milk is the best nutrition for your baby. You can feel included by burping the baby and bottle-feeding the baby with breast milk that was collected from the mother. This allows your partner to rest and helps you to bond with your baby. Sexual Activity It may take a few months for your partner's body to heal and be ready for sexual intercourse again. This may take longer after a cesarean delivery. If you have any questions about having sexual intercourse or if it is painful for your partner, talk with your health care provider. It is possible for breastfeeding mothers to become pregnant even if they are not having menstrual periods. Use birth control (contraception) unless you and your partner would like to become pregnant again. What should I remember? Fatherhood and having a baby is an ongoing learning experience. It is common to be anxious, concerned, or afraid that you may not be taking care of your newborn baby properly. It is important to talk with your partner and your health care provider if you are worried or have any questions. This information is not intended to replace advice given to you by your health care provider. Make sure you discuss any questions you have with your health care provider. Document Released: 07/16/2007 Document Revised: 07/02/2015  Document Reviewed: 10/14/2013 Elsevier Interactive Patient  Education  2017 [employer].

## 2017-06-30 LAB — GLUCOSE TOLERANCE, 2 HOURS W/ 1HR
Glucose, 1 hour: 126 mg/dL (ref 65–179)
Glucose, 2 hour: 100 mg/dL (ref 65–152)
Glucose, Fasting: 72 mg/dL (ref 65–91)

## 2017-06-30 LAB — CBC
HEMOGLOBIN: 9.9 g/dL — AB (ref 11.1–15.9)
Hematocrit: 30.4 % — ABNORMAL LOW (ref 34.0–46.6)
MCH: 29 pg (ref 26.6–33.0)
MCHC: 32.6 g/dL (ref 31.5–35.7)
MCV: 89 fL (ref 79–97)
Platelets: 295 10*3/uL (ref 150–450)
RBC: 3.41 x10E6/uL — AB (ref 3.77–5.28)
RDW: 13.6 % (ref 12.3–15.4)
WBC: 11.1 10*3/uL — ABNORMAL HIGH (ref 3.4–10.8)

## 2017-06-30 LAB — HIV ANTIBODY (ROUTINE TESTING W REFLEX): HIV Screen 4th Generation wRfx: NONREACTIVE

## 2017-06-30 LAB — RPR: RPR Ser Ql: NONREACTIVE

## 2017-07-01 ENCOUNTER — Other Ambulatory Visit: Payer: Self-pay | Admitting: Certified Nurse Midwife

## 2017-07-01 DIAGNOSIS — Z3400 Encounter for supervision of normal first pregnancy, unspecified trimester: Secondary | ICD-10-CM

## 2017-07-01 DIAGNOSIS — O99013 Anemia complicating pregnancy, third trimester: Secondary | ICD-10-CM

## 2017-07-01 DIAGNOSIS — O99019 Anemia complicating pregnancy, unspecified trimester: Secondary | ICD-10-CM | POA: Insufficient documentation

## 2017-07-01 MED ORDER — CITRANATAL BLOOM 90-1 MG PO TABS: 1.0000 | ORAL_TABLET | Freq: Every day | ORAL | 12 refills | Status: DC

## 2017-07-13 ENCOUNTER — Encounter: Payer: Medicaid Other | Admitting: Certified Nurse Midwife

## 2017-07-13 ENCOUNTER — Ambulatory Visit (INDEPENDENT_AMBULATORY_CARE_PROVIDER_SITE_OTHER): Payer: Medicaid Other | Admitting: Certified Nurse Midwife

## 2017-07-13 VITALS — BP 120/76 | HR 89 | Wt 199.0 lb

## 2017-07-13 DIAGNOSIS — O99013 Anemia complicating pregnancy, third trimester: Secondary | ICD-10-CM

## 2017-07-13 DIAGNOSIS — Z3400 Encounter for supervision of normal first pregnancy, unspecified trimester: Secondary | ICD-10-CM

## 2017-07-13 NOTE — Progress Notes (Signed)
   PRENATAL VISIT NOTE  Subjective:  Misty Thompson is a 27 y.o. G1P0 at 255w5d being seen today for ongoing prenatal care.  She is currently monitored for the following issues for this low-risk pregnancy and has Encounter for supervision of normal pregnancy, unspecified, unspecified trimester and Anemia in pregnancy on their problem list.  Patient reports no complaints.  Contractions: Not present. Vag. Bleeding: None.  Movement: Present. Denies leaking of fluid.   The following portions of the patient's history were reviewed and updated as appropriate: allergies, current medications, past family history, past medical history, past social history, past surgical history and problem list. Problem list updated.  Objective:   Vitals:   07/13/17 1532  BP: 120/76  Pulse: 89  Weight: 199 lb (90.3 kg)    Fetal Status: Fetal Heart Rate (bpm): 152; doppler Fundal Height: 30 cm Movement: Present     General:  Alert, oriented and cooperative. Patient is in no acute distress.  Skin: Skin is warm and dry. No rash noted.   Cardiovascular: Normal heart rate noted  Respiratory: Normal respiratory effort, no problems with respiration noted  Abdomen: Soft, gravid, appropriate for gestational age.  Pain/Pressure: Absent     Pelvic: Cervical exam deferred        Extremities: Normal range of motion.  Edema: Trace  Mental Status: Normal mood and affect. Normal behavior. Normal judgment and thought content.   Assessment and Plan:  Pregnancy: G1P0 at 255w5d  1. Supervision of normal first pregnancy, antepartum     Doing well.   2. Anemia during pregnancy in third trimester     Taking iron, recheck HGB at next ROB.   Preterm labor symptoms and general obstetric precautions including but not limited to vaginal bleeding, contractions, leaking of fluid and fetal movement were reviewed in detail with the patient. Please refer to After Visit Summary for other counseling recommendations.  Return in about 2  weeks (around 07/27/2017) for ROB.  Future Appointments  Date Time Provider Department Center  07/28/2017 10:15 AM Roe Coombsenney, Vietta Bonifield A, CNM [facility] None    Roe Coombsachelle A Talib Headley, CNM

## 2017-07-22 DIAGNOSIS — Z3482 Encounter for supervision of other normal pregnancy, second trimester: Secondary | ICD-10-CM

## 2017-07-28 ENCOUNTER — Ambulatory Visit (INDEPENDENT_AMBULATORY_CARE_PROVIDER_SITE_OTHER): Payer: Medicaid Other | Admitting: Certified Nurse Midwife

## 2017-07-28 ENCOUNTER — Encounter: Payer: Self-pay | Admitting: Certified Nurse Midwife

## 2017-07-28 VITALS — BP 121/68 | HR 90 | Wt 199.0 lb

## 2017-07-28 DIAGNOSIS — Z3403 Encounter for supervision of normal first pregnancy, third trimester: Secondary | ICD-10-CM

## 2017-07-28 DIAGNOSIS — O99013 Anemia complicating pregnancy, third trimester: Secondary | ICD-10-CM

## 2017-07-28 DIAGNOSIS — Z3400 Encounter for supervision of normal first pregnancy, unspecified trimester: Secondary | ICD-10-CM

## 2017-07-28 MED ORDER — VITAFOL GUMMIES 3.33-0.333-34.8 MG PO CHEW: 3.0000 | CHEWABLE_TABLET | Freq: Every day | ORAL | 11 refills | Status: DC

## 2017-07-28 NOTE — Progress Notes (Signed)
   PRENATAL VISIT NOTE  Subjective:  Misty Thompson is a 27 y.o. G1P0 at 3556w6d being seen today for ongoing prenatal care.  She is currently monitored for the following issues for this low-risk pregnancy and has Encounter for supervision of normal pregnancy, unspecified, unspecified trimester and Anemia in pregnancy on their problem list.  Patient reports no complaints.  Contractions: Not present. Vag. Bleeding: None.  Movement: Present. Denies leaking of fluid.   The following portions of the patient's history were reviewed and updated as appropriate: allergies, current medications, past family history, past medical history, past social history, past surgical history and problem list. Problem list updated.  Objective:   Vitals:   07/28/17 1027  BP: 121/68  Pulse: 90  Weight: 199 lb (90.3 kg)    Fetal Status: Fetal Heart Rate (bpm): 148; doppler Fundal Height: 31 cm Movement: Present     General:  Alert, oriented and cooperative. Patient is in no acute distress.  Skin: Skin is warm and dry. No rash noted.   Cardiovascular: Normal heart rate noted  Respiratory: Normal respiratory effort, no problems with respiration noted  Abdomen: Soft, gravid, appropriate for gestational age.  Pain/Pressure: Present     Pelvic: Cervical exam deferred        Extremities: Normal range of motion.     Mental Status: Normal mood and affect. Normal behavior. Normal judgment and thought content.   Assessment and Plan:  Pregnancy: G1P0 at 4856w6d  1. Supervision of normal first pregnancy, antepartum      Doing well - Prenatal Vit-Fe Phos-FA-Omega (VITAFOL GUMMIES) 3.33-0.333-34.8 MG CHEW; Chew 3 each by mouth daily.  Dispense: 90 tablet; Refill: 11  2. Anemia during pregnancy in third trimester     F/U CBC today.   - CBC - Ferritin  Preterm labor symptoms and general obstetric precautions including but not limited to vaginal bleeding, contractions, leaking of fluid and fetal movement were reviewed  in detail with the patient. Please refer to After Visit Summary for other counseling recommendations.  Return in about 2 weeks (around 08/11/2017) for ROB.  No future appointments.  Roe Coombsachelle A Nafeesa Dils, CNM

## 2017-07-29 LAB — CBC
HEMATOCRIT: 30.1 % — AB (ref 34.0–46.6)
HEMOGLOBIN: 9.3 g/dL — AB (ref 11.1–15.9)
MCH: 27.9 pg (ref 26.6–33.0)
MCHC: 30.9 g/dL — ABNORMAL LOW (ref 31.5–35.7)
MCV: 90 fL (ref 79–97)
Platelets: 283 10*3/uL (ref 150–450)
RBC: 3.33 x10E6/uL — ABNORMAL LOW (ref 3.77–5.28)
RDW: 14 % (ref 12.3–15.4)
WBC: 10.5 10*3/uL (ref 3.4–10.8)

## 2017-07-29 LAB — FERRITIN: Ferritin: 12 ng/mL — ABNORMAL LOW (ref 15–150)

## 2017-07-31 ENCOUNTER — Other Ambulatory Visit: Payer: Self-pay | Admitting: Certified Nurse Midwife

## 2017-07-31 DIAGNOSIS — O99013 Anemia complicating pregnancy, third trimester: Secondary | ICD-10-CM

## 2017-07-31 MED ORDER — CITRANATAL BLOOM 90-1 MG PO TABS: 1.0000 | ORAL_TABLET | Freq: Every day | ORAL | 12 refills | Status: DC

## 2017-08-11 ENCOUNTER — Encounter: Payer: Medicaid Other | Admitting: Certified Nurse Midwife

## 2017-08-12 ENCOUNTER — Encounter: Payer: Self-pay | Admitting: Certified Nurse Midwife

## 2017-08-12 ENCOUNTER — Ambulatory Visit (INDEPENDENT_AMBULATORY_CARE_PROVIDER_SITE_OTHER): Payer: Medicaid Other | Admitting: Certified Nurse Midwife

## 2017-08-12 VITALS — BP 135/78 | HR 86 | Wt 202.0 lb

## 2017-08-12 DIAGNOSIS — O99013 Anemia complicating pregnancy, third trimester: Secondary | ICD-10-CM

## 2017-08-12 DIAGNOSIS — D649 Anemia, unspecified: Secondary | ICD-10-CM

## 2017-08-12 DIAGNOSIS — O26843 Uterine size-date discrepancy, third trimester: Secondary | ICD-10-CM

## 2017-08-12 DIAGNOSIS — Z3400 Encounter for supervision of normal first pregnancy, unspecified trimester: Secondary | ICD-10-CM

## 2017-08-12 NOTE — Progress Notes (Signed)
   PRENATAL VISIT NOTE  Subjective:  Antony Hastealiyah M Woodrick is a 27 y.o. G1P0 at 6952w0d being seen today for ongoing prenatal care.  She is currently monitored for the following issues for this low-risk pregnancy and has Encounter for supervision of normal pregnancy, unspecified, unspecified trimester and Anemia in pregnancy on their problem list.  Patient reports no complaints.  Contractions: Irritability. Vag. Bleeding: None.  Movement: Present. Denies leaking of fluid.   The following portions of the patient's history were reviewed and updated as appropriate: allergies, current medications, past family history, past medical history, past social history, past surgical history and problem list. Problem list updated.  Objective:   Vitals:   08/12/17 0939  BP: 135/78  Pulse: 86  Weight: 202 lb (91.6 kg)    Fetal Status: Fetal Heart Rate (bpm): 152; doppler Fundal Height: 36 cm Movement: Present     General:  Alert, oriented and cooperative. Patient is in no acute distress.  Skin: Skin is warm and dry. No rash noted.   Cardiovascular: Normal heart rate noted  Respiratory: Normal respiratory effort, no problems with respiration noted  Abdomen: Soft, gravid, appropriate for gestational age.  Pain/Pressure: Present     Pelvic: Cervical exam deferred        Extremities: Normal range of motion.  Edema: Trace  Mental Status: Normal mood and affect. Normal behavior. Normal judgment and thought content.   Assessment and Plan:  Pregnancy: G1P0 at 7052w0d  1. Supervision of normal first pregnancy, antepartum     Doing well.   - US MFM OB FOLLOW UP; Future  2. Anemia during pregnancy in third trimester     Taking iron HGB: 9.3  3. Uterine size date discrepancy pregnancy, third trimester     S>D, large growth noted from previous exam. By Leopold's: 5+lbs - US MFM OB FOLLOW UP; Future  Preterm labor symptoms and general obstetric precautions including but not limited to vaginal bleeding,  contractions, leaking of fluid and fetal movement were reviewed in detail with the patient. Please refer to After Visit Summary for other counseling recommendations.  Return in about 1 week (around 08/19/2017) for ROB, GBS.  No future appointments.  Roe Coombsachelle A Refael Fulop, CNM

## 2017-08-19 ENCOUNTER — Encounter: Payer: Medicaid Other | Admitting: Certified Nurse Midwife

## 2017-08-20 ENCOUNTER — Ambulatory Visit (INDEPENDENT_AMBULATORY_CARE_PROVIDER_SITE_OTHER): Payer: Medicaid Other | Admitting: Certified Nurse Midwife

## 2017-08-20 ENCOUNTER — Encounter: Payer: Self-pay | Admitting: Certified Nurse Midwife

## 2017-08-20 VITALS — BP 127/69 | HR 79 | Wt 202.8 lb

## 2017-08-20 DIAGNOSIS — O99013 Anemia complicating pregnancy, third trimester: Secondary | ICD-10-CM

## 2017-08-20 DIAGNOSIS — Z3400 Encounter for supervision of normal first pregnancy, unspecified trimester: Secondary | ICD-10-CM

## 2017-08-20 NOTE — Patient Instructions (Signed)
Anemia Anemia is a condition in which you do not have enough red blood cells or hemoglobin. Hemoglobin is a substance in red blood cells that carries oxygen. When you do not have enough red blood cells or hemoglobin (are anemic), your body cannot get enough oxygen and your organs may not work properly. As a result, you may feel very tired or have other problems. What are the causes? Common causes of anemia include:  Excessive bleeding. Anemia can be caused by excessive bleeding inside or outside the body, including bleeding from the intestine or from periods in women.  Poor nutrition.  Long-lasting (chronic) kidney, thyroid, and liver disease.  Bone marrow disorders.  Cancer and treatments for cancer.  HIV (human immunodeficiency virus) and AIDS (acquired immunodeficiency syndrome).  Treatments for HIV and AIDS.  Spleen problems.  Blood disorders.  Infections, medicines, and autoimmune disorders that destroy red blood cells.  What are the signs or symptoms? Symptoms of this condition include:  Minor weakness.  Dizziness.  Headache.  Feeling heartbeats that are irregular or faster than normal (palpitations).  Shortness of breath, especially with exercise.  Paleness.  Cold sensitivity.  Indigestion.  Nausea.  Difficulty sleeping.  Difficulty concentrating.  Symptoms may occur suddenly or develop slowly. If your anemia is mild, you may not have symptoms. How is this diagnosed? This condition is diagnosed based on:  Blood tests.  Your medical history.  A physical exam.  Bone marrow biopsy.  Your health care provider may also check your stool (feces) for blood and may do additional testing to look for the cause of your bleeding. You may also have other tests, including:  Imaging tests, such as a CT scan or MRI.  Endoscopy.  Colonoscopy.  How is this treated? Treatment for this condition depends on the cause. If you continue to lose a lot of blood,  you may need to be treated at a hospital. Treatment may include:  Taking supplements of iron, vitamin B12, or folic acid.  Taking a hormone medicine (erythropoietin) that can help to stimulate red blood cell growth.  Having a blood transfusion. This may be needed if you lose a lot of blood.  Making changes to your diet.  Having surgery to remove your spleen.  Follow these instructions at home:  Take over-the-counter and prescription medicines only as told by your health care provider.  Take supplements only as told by your health care provider.  Follow any diet instructions that you were given.  Keep all follow-up visits as told by your health care provider. This is important. Contact a health care provider if:  You develop new bleeding anywhere in the body. Get help right away if:  You are very weak.  You are short of breath.  You have pain in your abdomen or chest.  You are dizzy or feel faint.  You have trouble concentrating.  You have bloody or black, tarry stools.  You vomit repeatedly or you vomit up blood. Summary  Anemia is a condition in which you do not have enough red blood cells or enough of a substance in your red blood cells that carries oxygen (hemoglobin).  Symptoms may occur suddenly or develop slowly.  If your anemia is mild, you may not have symptoms.  This condition is diagnosed with blood tests as well as a medical history and physical exam. Other tests may be needed.  Treatment for this condition depends on the cause of the anemia. This information is not intended to replace advice   given to you by your health care provider. Make sure you discuss any questions you have with your health care provider. Document Released: 03/06/2004 Document Revised: 02/29/2016 Document Reviewed: 02/29/2016 Elsevier Interactive Patient Education  2018 [place] Before your baby arrives it is important to:  Have all of the supplies  that you will need to care for your baby.  Know where to go if there is an emergency.  Discuss the baby's arrival with other family members.  What supplies will I need?  It is recommended that you have the following supplies: Large Items  Crib.  Crib mattress.  Rear-facing infant car seat. If possible, have a trained professional check to make sure that it is installed correctly.  Feeding  6-8 bottles that are 4-5 oz in size.  6-8 nipples.  Bottle brush.  Sterilizer, or a large pan or kettle with a lid.  A way to boil and cool water.  If you will be breastfeeding: ? Breast pump. ? Nipple cream. ? Nursing bra. ? Breast pads. ? Breast shields.  If you will be formula feeding: ? Formula. ? Measuring cups. ? Measuring spoons.  Bathing  Mild baby soap and baby shampoo.  Petroleum jelly.  Soft cloth towel and washcloth.  Hooded towel.  Cotton balls.  Bath basin.  Other Supplies  Rectal thermometer.  Bulb syringe.  Baby wipes or washcloths for diaper changes.  Diaper bag.  Changing pad.  Clothing, including one-piece outfits and pajamas.  Baby nail clippers.  Receiving blankets.  Mattress pad and sheets for the crib.  Night-light for the baby's room.  Baby monitor.  2 or 3 pacifiers.  Either 24-36 cloth diapers and waterproof diaper covers or a box of disposable diapers. You may need to use as many as 10-12 diapers per day.  How do I prepare for an emergency? Prepare for an emergency by:  Knowing how to get to the nearest hospital.  Listing the phone numbers of your baby's health care providers near your home phone and in your cell phone.  How do I prepare my family?  Decide how to handle visitors.  If you have other children: ? Talk with them about the baby coming home. Ask them how they feel about it. ? Read a book together about being a new big brother or sister. ? Find ways to let them help you prepare for the new  baby. ? Have someone ready to care for them while you are in the hospital. This information is not intended to replace advice given to you by your health care provider. Make sure you discuss any questions you have with your health care provider. Document Released: 01/10/2008 Document Revised: 07/05/2015 Document Reviewed: 01/04/2014 Elsevier Interactive Patient Education  2018 [employer].  [employer] of the uterus can occur throughout pregnancy, but they are not always a sign that you are in labor. You may have practice contractions called Braxton Hicks contractions. These false labor contractions are sometimes confused with true labor. What are [place] contractions? Braxton Hicks contractions are tightening movements that occur in the muscles of the uterus before labor. Unlike true labor contractions, these contractions do not result in opening (dilation) and thinning of the cervix. Toward the end of pregnancy (32-34 weeks), Braxton Hicks contractions can happen more often and may become stronger. These contractions are sometimes difficult to tell apart from true labor because they can be very uncomfortable. You should not feel embarrassed if  you go to the hospital with false labor. Sometimes, the only way to tell if you are in true labor is for your health care provider to look for changes in the cervix. The health care provider will do a physical exam and may monitor your contractions. If you are not in true labor, the exam should show that your cervix is not dilating and your water has not broken. If there are other health problems associated with your pregnancy, it is completely safe for you to be sent home with false labor. You may continue to have Braxton Hicks contractions until you go into true labor. How to tell the difference between true labor and false labor True labor  Contractions last 30-70 seconds.  Contractions become very  regular.  Discomfort is usually felt in the top of the uterus, and it spreads to the lower abdomen and low back.  Contractions do not go away with walking.  Contractions usually become more intense and increase in frequency.  The cervix dilates and gets thinner. False labor  Contractions are usually shorter and not as strong as true labor contractions.  Contractions are usually irregular.  Contractions are often felt in the front of the lower abdomen and in the groin.  Contractions may go away when you walk around or change positions while lying down.  Contractions get weaker and are shorter-lasting as time goes on.  The cervix usually does not dilate or become thin. Follow these instructions at home:  Take over-the-counter and prescription medicines only as told by your health care provider.  Keep up with your usual exercises and follow other instructions from your health care provider.  Eat and drink lightly if you think you are going into labor.  If Braxton Hicks contractions are making you uncomfortable: ? Change your position from lying down or resting to walking, or change from walking to resting. ? Sit and rest in a tub of warm water. ? Drink enough fluid to keep your urine pale yellow. Dehydration may cause these contractions. ? Do slow and deep breathing several times an hour.  Keep all follow-up prenatal visits as told by your health care provider. This is important. Contact a health care provider if:  You have a fever.  You have continuous pain in your abdomen. Get help right away if:  Your contractions become stronger, more regular, and closer together.  You have fluid leaking or gushing from your vagina.  You pass blood-tinged mucus (bloody show).  You have bleeding from your vagina.  You have low back pain that you never had before.  You feel your baby's head pushing down and causing pelvic pressure.  Your baby is not moving inside you as much as  it used to. Summary  Contractions that occur before labor are called Braxton Hicks contractions, false labor, or practice contractions.  Braxton Hicks contractions are usually shorter, weaker, farther apart, and less regular than true labor contractions. True labor contractions usually become progressively stronger and regular and they become more frequent.  Manage discomfort from [facility] contractions by changing position, resting in a warm bath, drinking plenty of water, or practicing deep breathing. This information is not intended to replace advice given to you by your health care provider. Make sure you discuss any questions you have with your health care provider. Document Released: 06/12/2016 Document Revised: 06/12/2016 Document Reviewed: 06/12/2016 Elsevier Interactive Patient Education  2018 [place] of Pregnancy The third trimester is from week 28 through  week 40 (months 7 through 9). The third trimester is a time when the unborn baby (fetus) is growing rapidly. At the end of the ninth month, the fetus is about 20 inches in length and weighs 6-10 pounds. Body changes during your third trimester Your body will continue to go through many changes during pregnancy. The changes vary from woman to woman. During the third trimester:  Your weight will continue to increase. You can expect to gain 25-35 pounds (11-16 kg) by the end of the pregnancy.  You may begin to get stretch marks on your hips, abdomen, and breasts.  You may urinate more often because the fetus is moving lower into your pelvis and pressing on your bladder.  You may develop or continue to have heartburn. This is caused by increased hormones that slow down muscles in the digestive tract.  You may develop or continue to have constipation because increased hormones slow digestion and cause the muscles that push waste through your intestines to relax.  You may develop hemorrhoids. These are  swollen veins (varicose veins) in the rectum that can itch or be painful.  You may develop swollen, bulging veins (varicose veins) in your legs.  You may have increased body aches in the pelvis, back, or thighs. This is due to weight gain and increased hormones that are relaxing your joints.  You may have changes in your hair. These can include thickening of your hair, rapid growth, and changes in texture. Some women also have hair loss during or after pregnancy, or hair that feels dry or thin. Your hair will most likely return to normal after your baby is born.  Your breasts will continue to grow and they will continue to become tender. A yellow fluid (colostrum) may leak from your breasts. This is the first milk you are producing for your baby.  Your belly button may stick out.  You may notice more swelling in your hands, face, or ankles.  You may have increased tingling or numbness in your hands, arms, and legs. The skin on your belly may also feel numb.  You may feel short of breath because of your expanding uterus.  You may have more problems sleeping. This can be caused by the size of your belly, increased need to urinate, and an increase in your body's metabolism.  You may notice the fetus "dropping," or moving lower in your abdomen (lightening).  You may have increased vaginal discharge.  You may notice your joints feel loose and you may have pain around your pelvic bone.  What to expect at prenatal visits You will have prenatal exams every 2 weeks until week 36. Then you will have weekly prenatal exams. During a routine prenatal visit:  You will be weighed to make sure you and the baby are growing normally.  Your blood pressure will be taken.  Your abdomen will be measured to track your baby's growth.  The fetal heartbeat will be listened to.  Any test results from the previous visit will be discussed.  You may have a cervical check near your due date to see if your  cervix has softened or thinned (effaced).  You will be tested for Group B streptococcus. This happens between 35 and 37 weeks.  Your health care provider may ask you:  What your birth plan is.  How you are feeling.  If you are feeling the baby move.  If you have had any abnormal symptoms, such as leaking fluid, bleeding, severe headaches, or  abdominal cramping.  If you are using any tobacco products, including cigarettes, chewing tobacco, and electronic cigarettes.  If you have any questions.  Other tests or screenings that may be performed during your third trimester include:  Blood tests that check for low iron levels (anemia).  Fetal testing to check the health, activity level, and growth of the fetus. Testing is done if you have certain medical conditions or if there are problems during the pregnancy.  Nonstress test (NST). This test checks the health of your baby to make sure there are no signs of problems, such as the baby not getting enough oxygen. During this test, a belt is placed around your belly. The baby is made to move, and its heart rate is monitored during movement.  What is false labor? False labor is a condition in which you feel small, irregular tightenings of the muscles in the womb (contractions) that usually go away with rest, changing position, or drinking water. These are called Braxton Hicks contractions. Contractions may last for hours, days, or even weeks before true labor sets in. If contractions come at regular intervals, become more frequent, increase in intensity, or become painful, you should see your health care provider. What are the signs of labor?  Abdominal cramps.  Regular contractions that start at 10 minutes apart and become stronger and more frequent with time.  Contractions that start on the top of the uterus and spread down to the lower abdomen and back.  Increased pelvic pressure and dull back pain.  A watery or bloody mucus discharge  that comes from the vagina.  Leaking of amniotic fluid. This is also known as your "water breaking." It could be a slow trickle or a gush. Let your health care provider know if it has a color or strange odor. If you have any of these signs, call your health care provider right away, even if it is before your due date. Follow these instructions at home: Medicines  Follow your health care provider's instructions regarding medicine use. Specific medicines may be either safe or unsafe to take during pregnancy.  Take a prenatal vitamin that contains at least 600 micrograms (mcg) of folic acid.  If you develop constipation, try taking a stool softener if your health care provider approves. Eating and drinking  Eat a balanced diet that includes fresh fruits and vegetables, whole grains, good sources of protein such as meat, eggs, or tofu, and low-fat dairy. Your health care provider will help you determine the amount of weight gain that is right for you.  Avoid raw meat and uncooked cheese. These carry germs that can cause birth defects in the baby.  If you have low calcium intake from food, talk to your health care provider about whether you should take a daily calcium supplement.  Eat four or five small meals rather than three large meals a day.  Limit foods that are high in fat and processed sugars, such as fried and sweet foods.  To prevent constipation: ? Drink enough fluid to keep your urine clear or pale yellow. ? Eat foods that are high in fiber, such as fresh fruits and vegetables, whole grains, and beans. Activity  Exercise only as directed by your health care provider. Most women can continue their usual exercise routine during pregnancy. Try to exercise for 30 minutes at least 5 days a week. Stop exercising if you experience uterine contractions.  Avoid heavy lifting.  Do not exercise in extreme heat or humidity, or at  high altitudes.  Wear low-heel, comfortable  shoes.  Practice good posture.  You may continue to have sex unless your health care provider tells you otherwise. Relieving pain and discomfort  Take frequent breaks and rest with your legs elevated if you have leg cramps or low back pain.  Take warm sitz baths to soothe any pain or discomfort caused by hemorrhoids. Use hemorrhoid cream if your health care provider approves.  Wear a good support bra to prevent discomfort from breast tenderness.  If you develop varicose veins: ? Wear support pantyhose or compression stockings as told by your healthcare provider. ? Elevate your feet for 15 minutes, 3-4 times a day. Prenatal care  Write down your questions. Take them to your prenatal visits.  Keep all your prenatal visits as told by your health care provider. This is important. Safety  Wear your seat belt at all times when driving.  Make a list of emergency phone numbers, including numbers for family, friends, the hospital, and police and fire departments. General instructions  Avoid cat litter boxes and soil used by cats. These carry germs that can cause birth defects in the baby. If you have a cat, ask someone to clean the litter box for you.  Do not travel far distances unless it is absolutely necessary and only with the approval of your health care provider.  Do not use hot tubs, steam rooms, or saunas.  Do not drink alcohol.  Do not use any products that contain nicotine or tobacco, such as cigarettes and e-cigarettes. If you need help quitting, ask your health care provider.  Do not use any medicinal herbs or unprescribed drugs. These chemicals affect the formation and growth of the baby.  Do not douche or use tampons or scented sanitary pads.  Do not cross your legs for long periods of time.  To prepare for the arrival of your baby: ? Take prenatal classes to understand, practice, and ask questions about labor and delivery. ? Make a trial run to the  hospital. ? Visit the hospital and tour the maternity area. ? Arrange for maternity or paternity leave through employers. ? Arrange for family and friends to take care of pets while you are in the hospital. ? Purchase a rear-facing car seat and make sure you know how to install it in your car. ? Pack your hospital bag. ? Prepare the baby's nursery. Make sure to remove all pillows and stuffed animals from the baby's crib to prevent suffocation.  Visit your dentist if you have not gone during your pregnancy. Use a soft toothbrush to brush your teeth and be gentle when you floss. Contact a health care provider if:  You are unsure if you are in labor or if your water has broken.  You become dizzy.  You have mild pelvic cramps, pelvic pressure, or nagging pain in your abdominal area.  You have lower back pain.  You have persistent nausea, vomiting, or diarrhea.  You have an unusual or bad smelling vaginal discharge.  You have pain when you urinate. Get help right away if:  Your water breaks before 37 weeks.  You have regular contractions less than 5 minutes apart before 37 weeks.  You have a fever.  You are leaking fluid from your vagina.  You have spotting or bleeding from your vagina.  You have severe abdominal pain or cramping.  You have rapid weight loss or weight gain.  You have shortness of breath with chest pain.  You notice  sudden or extreme swelling of your face, hands, ankles, feet, or legs.  Your baby makes fewer than 10 movements in 2 hours.  You have severe headaches that do not go away when you take medicine.  You have vision changes. Summary  The third trimester is from week 28 through week 40, months 7 through 9. The third trimester is a time when the unborn baby (fetus) is growing rapidly.  During the third trimester, your discomfort may increase as you and your baby continue to gain weight. You may have abdominal, leg, and back pain, sleeping  problems, and an increased need to urinate.  During the third trimester your breasts will keep growing and they will continue to become tender. A yellow fluid (colostrum) may leak from your breasts. This is the first milk you are producing for your baby.  False labor is a condition in which you feel small, irregular tightenings of the muscles in the womb (contractions) that eventually go away. These are called Braxton Hicks contractions. Contractions may last for hours, days, or even weeks before true labor sets in.  Signs of labor can include: abdominal cramps; regular contractions that start at 10 minutes apart and become stronger and more frequent with time; watery or bloody mucus discharge that comes from the vagina; increased pelvic pressure and dull back pain; and leaking of amniotic fluid. This information is not intended to replace advice given to you by your health care provider. Make sure you discuss any questions you have with your health care provider. Document Released: 01/21/2001 Document Revised: 07/05/2015 Document Reviewed: 03/30/2012 Elsevier Interactive Patient Education  2017 [employer].

## 2017-08-20 NOTE — Progress Notes (Signed)
   PRENATAL VISIT NOTE  Subjective:  Misty Thompson is a 27 y.o. G1P0 at 7879w1d being seen today for ongoing prenatal care.  She is currently monitored for the following issues for this low-risk pregnancy and has Encounter for supervision of normal pregnancy, unspecified, unspecified trimester and Anemia in pregnancy on their problem list.  Patient reports no complaints.  Contractions: Not present. Vag. Bleeding: None.  Movement: Present. Denies leaking of fluid.   The following portions of the patient's history were reviewed and updated as appropriate: allergies, current medications, past family history, past medical history, past social history, past surgical history and problem list. Problem list updated.  Objective:   Vitals:   08/20/17 1053  BP: 127/69  Pulse: 79  Weight: 202 lb 12.8 oz (92 kg)    Fetal Status: Fetal Heart Rate (bpm): 148; doppler Fundal Height: 35 cm Movement: Present     General:  Alert, oriented and cooperative. Patient is in no acute distress.  Skin: Skin is warm and dry. No rash noted.   Cardiovascular: Normal heart rate noted  Respiratory: Normal respiratory effort, no problems with respiration noted  Abdomen: Soft, gravid, appropriate for gestational age.  Pain/Pressure: Absent     Pelvic: Cervical exam deferred        Extremities: Normal range of motion.  Edema: Trace  Mental Status: Normal mood and affect. Normal behavior. Normal judgment and thought content.   Assessment and Plan:  Pregnancy: G1P0 at 6779w1d  1. Supervision of normal first pregnancy, antepartum     Doing well.  Has growth US scheduled for 09/14/17.    2. Anemia during pregnancy in third trimester     Taking Bloom daily  Preterm labor symptoms and general obstetric precautions including but not limited to vaginal bleeding, contractions, leaking of fluid and fetal movement were reviewed in detail with the patient. Please refer to After Visit Summary for other counseling  recommendations.  Return in about 1 week (around 08/27/2017) for ROB, GBS.  Future Appointments  Date Time Provider Department Center  09/14/2017 11:15 AM [facility] US 4 [facility]    Roe Coombsachelle A Emil Weigold, CNM

## 2017-08-20 NOTE — Progress Notes (Signed)
Patient reports good fetal movement, denies pain. 

## 2017-08-31 ENCOUNTER — Ambulatory Visit (INDEPENDENT_AMBULATORY_CARE_PROVIDER_SITE_OTHER): Payer: Medicaid Other | Admitting: Advanced Practice Midwife

## 2017-08-31 ENCOUNTER — Other Ambulatory Visit (HOSPITAL_COMMUNITY)
Admission: RE | Admit: 2017-08-31 | Discharge: 2017-08-31 | Disposition: A | Discharge status: Home / Self Care | Payer: Medicaid Other | Source: Ambulatory Visit | Attending: Advanced Practice Midwife | Admitting: Advanced Practice Midwife

## 2017-08-31 VITALS — BP 132/78 | HR 80 | Wt 202.6 lb

## 2017-08-31 DIAGNOSIS — Z3403 Encounter for supervision of normal first pregnancy, third trimester: Secondary | ICD-10-CM | POA: Diagnosis present

## 2017-08-31 DIAGNOSIS — O163 Unspecified maternal hypertension, third trimester: Secondary | ICD-10-CM

## 2017-08-31 LAB — OB RESULTS CONSOLE GC/CHLAMYDIA: Gonorrhea: NEGATIVE

## 2017-08-31 NOTE — Patient Instructions (Signed)
Third Trimester of Pregnancy The third trimester is from week 28 through week 40 (months 7 through 9). The third trimester is a time when the unborn baby (fetus) is growing rapidly. At the end of the ninth month, the fetus is about 20 inches in length and weighs 6-10 pounds. Body changes during your third trimester Your body will continue to go through many changes during pregnancy. The changes vary from woman to woman. During the third trimester:  Your weight will continue to increase. You can expect to gain 25-35 pounds (11-16 kg) by the end of the pregnancy.  You may begin to get stretch marks on your hips, abdomen, and breasts.  You may urinate more often because the fetus is moving lower into your pelvis and pressing on your bladder.  You may develop or continue to have heartburn. This is caused by increased hormones that slow down muscles in the digestive tract.  You may develop or continue to have constipation because increased hormones slow digestion and cause the muscles that push waste through your intestines to relax.  You may develop hemorrhoids. These are swollen veins (varicose veins) in the rectum that can itch or be painful.  You may develop swollen, bulging veins (varicose veins) in your legs.  You may have increased body aches in the pelvis, back, or thighs. This is due to weight gain and increased hormones that are relaxing your joints.  You may have changes in your hair. These can include thickening of your hair, rapid growth, and changes in texture. Some women also have hair loss during or after pregnancy, or hair that feels dry or thin. Your hair will most likely return to normal after your baby is born.  Your breasts will continue to grow and they will continue to become tender. A yellow fluid (colostrum) may leak from your breasts. This is the first milk you are producing for your baby.  Your belly button may stick out.  You may notice more swelling in your hands,  face, or ankles.  You may have increased tingling or numbness in your hands, arms, and legs. The skin on your belly may also feel numb.  You may feel short of breath because of your expanding uterus.  You may have more problems sleeping. This can be caused by the size of your belly, increased need to urinate, and an increase in your body's metabolism.  You may notice the fetus "dropping," or moving lower in your abdomen (lightening).  You may have increased vaginal discharge.  You may notice your joints feel loose and you may have pain around your pelvic bone.  What to expect at prenatal visits You will have prenatal exams every 2 weeks until week 36. Then you will have weekly prenatal exams. During a routine prenatal visit:  You will be weighed to make sure you and the baby are growing normally.  Your blood pressure will be taken.  Your abdomen will be measured to track your baby's growth.  The fetal heartbeat will be listened to.  Any test results from the previous visit will be discussed.  You may have a cervical check near your due date to see if your cervix has softened or thinned (effaced).  You will be tested for Group B streptococcus. This happens between 35 and 37 weeks.  Your health care provider may ask you:  What your birth plan is.  How you are feeling.  If you are feeling the baby move.  If you have had   any abnormal symptoms, such as leaking fluid, bleeding, severe headaches, or abdominal cramping.  If you are using any tobacco products, including cigarettes, chewing tobacco, and electronic cigarettes.  If you have any questions.  Other tests or screenings that may be performed during your third trimester include:  Blood tests that check for low iron levels (anemia).  Fetal testing to check the health, activity level, and growth of the fetus. Testing is done if you have certain medical conditions or if there are problems during the  pregnancy.  Nonstress test (NST). This test checks the health of your baby to make sure there are no signs of problems, such as the baby not getting enough oxygen. During this test, a belt is placed around your belly. The baby is made to move, and its heart rate is monitored during movement.  What is false labor? False labor is a condition in which you feel small, irregular tightenings of the muscles in the womb (contractions) that usually go away with rest, changing position, or drinking water. These are called Braxton Hicks contractions. Contractions may last for hours, days, or even weeks before true labor sets in. If contractions come at regular intervals, become more frequent, increase in intensity, or become painful, you should see your health care provider. What are the signs of labor?  Abdominal cramps.  Regular contractions that start at 10 minutes apart and become stronger and more frequent with time.  Contractions that start on the top of the uterus and spread down to the lower abdomen and back.  Increased pelvic pressure and dull back pain.  A watery or bloody mucus discharge that comes from the vagina.  Leaking of amniotic fluid. This is also known as your "water breaking." It could be a slow trickle or a gush. Let your health care provider know if it has a color or strange odor. If you have any of these signs, call your health care provider right away, even if it is before your due date. Follow these instructions at home: Medicines  Follow your health care provider's instructions regarding medicine use. Specific medicines may be either safe or unsafe to take during pregnancy.  Take a prenatal vitamin that contains at least 600 micrograms (mcg) of folic acid.  If you develop constipation, try taking a stool softener if your health care provider approves. Eating and drinking  Eat a balanced diet that includes fresh fruits and vegetables, whole grains, good sources of protein  such as meat, eggs, or tofu, and low-fat dairy. Your health care provider will help you determine the amount of weight gain that is right for you.  Avoid raw meat and uncooked cheese. These carry germs that can cause birth defects in the baby.  If you have low calcium intake from food, talk to your health care provider about whether you should take a daily calcium supplement.  Eat four or five small meals rather than three large meals a day.  Limit foods that are high in fat and processed sugars, such as fried and sweet foods.  To prevent constipation: ? Drink enough fluid to keep your urine clear or pale yellow. ? Eat foods that are high in fiber, such as fresh fruits and vegetables, whole grains, and beans. Activity  Exercise only as directed by your health care provider. Most women can continue their usual exercise routine during pregnancy. Try to exercise for 30 minutes at least 5 days a week. Stop exercising if you experience uterine contractions.  Avoid heavy   lifting.  Do not exercise in extreme heat or humidity, or at high altitudes.  Wear low-heel, comfortable shoes.  Practice good posture.  You may continue to have sex unless your health care provider tells you otherwise. Relieving pain and discomfort  Take frequent breaks and rest with your legs elevated if you have leg cramps or low back pain.  Take warm sitz baths to soothe any pain or discomfort caused by hemorrhoids. Use hemorrhoid cream if your health care provider approves.  Wear a good support bra to prevent discomfort from breast tenderness.  If you develop varicose veins: ? Wear support pantyhose or compression stockings as told by your healthcare provider. ? Elevate your feet for 15 minutes, 3-4 times a day. Prenatal care  Write down your questions. Take them to your prenatal visits.  Keep all your prenatal visits as told by your health care provider. This is important. Safety  Wear your seat belt at  all times when driving.  Make a list of emergency phone numbers, including numbers for family, friends, the hospital, and police and fire departments. General instructions  Avoid cat litter boxes and soil used by cats. These carry germs that can cause birth defects in the baby. If you have a cat, ask someone to clean the litter box for you.  Do not travel far distances unless it is absolutely necessary and only with the approval of your health care provider.  Do not use hot tubs, steam rooms, or saunas.  Do not drink alcohol.  Do not use any products that contain nicotine or tobacco, such as cigarettes and e-cigarettes. If you need help quitting, ask your health care provider.  Do not use any medicinal herbs or unprescribed drugs. These chemicals affect the formation and growth of the baby.  Do not douche or use tampons or scented sanitary pads.  Do not cross your legs for long periods of time.  To prepare for the arrival of your baby: ? Take prenatal classes to understand, practice, and ask questions about labor and delivery. ? Make a trial run to the hospital. ? Visit the hospital and tour the maternity area. ? Arrange for maternity or paternity leave through employers. ? Arrange for family and friends to take care of pets while you are in the hospital. ? Purchase a rear-facing car seat and make sure you know how to install it in your car. ? Pack your hospital bag. ? Prepare the baby's nursery. Make sure to remove all pillows and stuffed animals from the baby's crib to prevent suffocation.  Visit your dentist if you have not gone during your pregnancy. Use a soft toothbrush to brush your teeth and be gentle when you floss. Contact a health care provider if:  You are unsure if you are in labor or if your water has broken.  You become dizzy.  You have mild pelvic cramps, pelvic pressure, or nagging pain in your abdominal area.  You have lower back pain.  You have persistent  nausea, vomiting, or diarrhea.  You have an unusual or bad smelling vaginal discharge.  You have pain when you urinate. Get help right away if:  Your water breaks before 37 weeks.  You have regular contractions less than 5 minutes apart before 37 weeks.  You have a fever.  You are leaking fluid from your vagina.  You have spotting or bleeding from your vagina.  You have severe abdominal pain or cramping.  You have rapid weight loss or weight gain.    You have shortness of breath with chest pain.  You notice sudden or extreme swelling of your face, hands, ankles, feet, or legs.  Your baby makes fewer than 10 movements in 2 hours.  You have severe headaches that do not go away when you take medicine.  You have vision changes. Summary  The third trimester is from week 28 through week 40, months 7 through 9. The third trimester is a time when the unborn baby (fetus) is growing rapidly.  During the third trimester, your discomfort may increase as you and your baby continue to gain weight. You may have abdominal, leg, and back pain, sleeping problems, and an increased need to urinate.  During the third trimester your breasts will keep growing and they will continue to become tender. A yellow fluid (colostrum) may leak from your breasts. This is the first milk you are producing for your baby.  False labor is a condition in which you feel small, irregular tightenings of the muscles in the womb (contractions) that eventually go away. These are called Braxton Hicks contractions. Contractions may last for hours, days, or even weeks before true labor sets in.  Signs of labor can include: abdominal cramps; regular contractions that start at 10 minutes apart and become stronger and more frequent with time; watery or bloody mucus discharge that comes from the vagina; increased pelvic pressure and dull back pain; and leaking of amniotic fluid. This information is not intended to replace advice  given to you by your health care provider. Make sure you discuss any questions you have with your health care provider. Document Released: 01/21/2001 Document Revised: 07/05/2015 Document Reviewed: 03/30/2012 Elsevier Interactive Patient Education  2017 [employer]  

## 2017-08-31 NOTE — Progress Notes (Signed)
   PRENATAL VISIT NOTE  Subjective:  Misty Thompson is a 27 y.o. G1P0 at 3544w5d being seen today for ongoing prenatal care.  She is currently monitored for the following issues for this low-risk pregnancy and has Encounter for supervision of normal pregnancy, unspecified, unspecified trimester and Anemia in pregnancy on their problem list.  Patient reports no complaints.  Contractions: Irregular. Vag. Bleeding: None.  Movement: Present. Denies leaking of fluid.   The following portions of the patient's history were reviewed and updated as appropriate: allergies, current medications, past family history, past medical history, past social history, past surgical history and problem list. Problem list updated.  Objective:   Vitals:   08/31/17 0910  BP: 132/78  Pulse: 80  Weight: 202 lb 9.6 oz (91.9 kg)    Fetal Status: Fetal Heart Rate (bpm): 150 Fundal Height: 36 cm Movement: Present  Presentation: Vertex  General:  Alert, oriented and cooperative. Patient is in no acute distress.  Skin: Skin is warm and dry. No rash noted.   Cardiovascular: Normal heart rate noted  Respiratory: Normal respiratory effort, no problems with respiration noted  Abdomen: Soft, gravid, appropriate for gestational age.  Pain/Pressure: Present     Pelvic: Cervical exam performed Dilation: Closed Effacement (%): 50 Station: -3  Extremities: Normal range of motion.  Edema: Trace  Mental Status: Normal mood and affect. Normal behavior. Normal judgment and thought content.   Assessment and Plan:  Pregnancy: G1P0 at 8744w5d  1. Encounter for supervision of normal first pregnancy in third trimester --Anticipatory guidance about next visits/weeks of pregnancy given. --EFW 6lbs, vertex by [facility] today - Strep Gp B NAA - Cervicovaginal ancillary only  2. High blood pressure affecting pregnancy in third trimester, antepartum --Initial BP today 144/78, retake 132/78.  Pt denies h/a, epigastric pain, or visual  disturbances.   --Return in 1 week for BP check/prenatal visit. --Preeclampsia precautions/reasons to seek care thoroughly reviewed  Preterm labor symptoms and general obstetric precautions including but not limited to vaginal bleeding, contractions, leaking of fluid and fetal movement were reviewed in detail with the patient. Please refer to After Visit Summary for other counseling recommendations.  Return in about 1 week (around 09/07/2017).  Future Appointments  Date Time Provider Department Center  09/14/2017 11:15 AM [facility] US 4 [facility]    Sharen CounterLisa Leftwich-Kirby, CNM

## 2017-09-01 LAB — CERVICOVAGINAL ANCILLARY ONLY
Chlamydia: NEGATIVE
Neisseria Gonorrhea: NEGATIVE

## 2017-09-02 LAB — STREP GP B NAA: STREP GROUP B AG: POSITIVE — AB

## 2017-09-04 ENCOUNTER — Encounter: Payer: Self-pay | Admitting: Advanced Practice Midwife

## 2017-09-04 DIAGNOSIS — O99820 Streptococcus B carrier state complicating pregnancy: Secondary | ICD-10-CM | POA: Insufficient documentation

## 2017-09-07 ENCOUNTER — Encounter: Payer: Self-pay | Admitting: Obstetrics and Gynecology

## 2017-09-07 ENCOUNTER — Ambulatory Visit (INDEPENDENT_AMBULATORY_CARE_PROVIDER_SITE_OTHER): Payer: Medicaid Other | Admitting: Obstetrics and Gynecology

## 2017-09-07 VITALS — BP 127/78 | HR 93 | Wt 205.1 lb

## 2017-09-07 DIAGNOSIS — O99820 Streptococcus B carrier state complicating pregnancy: Secondary | ICD-10-CM

## 2017-09-07 DIAGNOSIS — Z3400 Encounter for supervision of normal first pregnancy, unspecified trimester: Secondary | ICD-10-CM

## 2017-09-07 DIAGNOSIS — Z3403 Encounter for supervision of normal first pregnancy, third trimester: Secondary | ICD-10-CM

## 2017-09-07 NOTE — Progress Notes (Signed)
   PRENATAL VISIT NOTE  Subjective:  Misty Thompson is a 27 y.o. G1P0 at 7562w5d being seen today for ongoing prenatal care.  She is currently monitored for the following issues for this low-risk pregnancy and has Encounter for supervision of normal pregnancy, unspecified, unspecified trimester; Anemia in pregnancy; and GBS (group B Streptococcus carrier), +RV culture, currently pregnant on their problem list.  Patient reports no complaints.  Contractions: Not present. Vag. Bleeding: None.  Movement: Present. Denies leaking of fluid.   The following portions of the patient's history were reviewed and updated as appropriate: allergies, current medications, past family history, past medical history, past social history, past surgical history and problem list. Problem list updated.  Objective:   Vitals:   09/07/17 0956  BP: 127/78  Pulse: 93  Weight: 205 lb 1.6 oz (93 kg)    Fetal Status: Fetal Heart Rate (bpm): 138 Fundal Height: 37 cm Movement: Present     General:  Alert, oriented and cooperative. Patient is in no acute distress.  Skin: Skin is warm and dry. No rash noted.   Cardiovascular: Normal heart rate noted  Respiratory: Normal respiratory effort, no problems with respiration noted  Abdomen: Soft, gravid, appropriate for gestational age.  Pain/Pressure: Absent     Pelvic: Cervical exam deferred        Extremities: Normal range of motion.     Mental Status: Normal mood and affect. Normal behavior. Normal judgment and thought content.   Assessment and Plan:  Pregnancy: G1P0 at 4962w5d  1. Supervision of normal first pregnancy, antepartum Patient is doing well without complaints Normotensive this visit  Follow up growth ultrasound on 8/5  2. GBS (group B Streptococcus carrier), +RV culture, currently pregnant Will provide prophylaxis in labor  Term labor symptoms and general obstetric precautions including but not limited to vaginal bleeding, contractions, leaking of fluid  and fetal movement were reviewed in detail with the patient. Please refer to After Visit Summary for other counseling recommendations.  Return in about 1 week (around 09/14/2017) for ROB.  Future Appointments  Date Time Provider Department Center  09/14/2017 11:15 AM [facility] US 4 [facility]    Catalina AntiguaPeggy Alyda Megna, MD

## 2017-09-14 ENCOUNTER — Ambulatory Visit (HOSPITAL_COMMUNITY)
Admission: RE | Admit: 2017-09-14 | Discharge: 2017-09-14 | Disposition: A | Discharge status: Home / Self Care | Payer: Medicaid Other | Source: Ambulatory Visit | Attending: Obstetrics | Admitting: Obstetrics

## 2017-09-14 DIAGNOSIS — O99212 Obesity complicating pregnancy, second trimester: Secondary | ICD-10-CM

## 2017-09-14 DIAGNOSIS — Z3A38 38 weeks gestation of pregnancy: Secondary | ICD-10-CM | POA: Diagnosis not present

## 2017-09-14 DIAGNOSIS — Z3400 Encounter for supervision of normal first pregnancy, unspecified trimester: Secondary | ICD-10-CM

## 2017-09-14 DIAGNOSIS — O26843 Uterine size-date discrepancy, third trimester: Secondary | ICD-10-CM | POA: Insufficient documentation

## 2017-09-14 DIAGNOSIS — Z362 Encounter for other antenatal screening follow-up: Secondary | ICD-10-CM | POA: Diagnosis not present

## 2017-09-15 ENCOUNTER — Ambulatory Visit (INDEPENDENT_AMBULATORY_CARE_PROVIDER_SITE_OTHER): Payer: Medicaid Other | Admitting: Obstetrics and Gynecology

## 2017-09-15 VITALS — BP 127/81 | HR 82 | Wt 205.4 lb

## 2017-09-15 DIAGNOSIS — Z3400 Encounter for supervision of normal first pregnancy, unspecified trimester: Secondary | ICD-10-CM

## 2017-09-15 DIAGNOSIS — O99820 Streptococcus B carrier state complicating pregnancy: Secondary | ICD-10-CM

## 2017-09-15 NOTE — Progress Notes (Signed)
   PRENATAL VISIT NOTE  Subjective:  Misty Thompson is a 27 y.o. G1P0 at 7174w6d being seen today for ongoing prenatal care.  She is currently monitored for the following issues for this low-risk pregnancy and has Encounter for supervision of normal pregnancy, unspecified, unspecified trimester; Anemia in pregnancy; and GBS (group B Streptococcus carrier), +RV culture, currently pregnant on their problem list.  Patient reports increased pressure.  Contractions: Irritability. Vag. Bleeding: None.  Movement: Present. Denies leaking of fluid.   The following portions of the patient's history were reviewed and updated as appropriate: allergies, current medications, past family history, past medical history, past social history, past surgical history and problem list. Problem list updated.  Objective:   Vitals:   09/15/17 1612  BP: 127/81  Pulse: 82  Weight: 205 lb 6.4 oz (93.2 kg)    Fetal Status: Fetal Heart Rate (bpm): 145   Movement: Present     General:  Alert, oriented and cooperative. Patient is in no acute distress.  Skin: Skin is warm and dry. No rash noted.   Cardiovascular: Normal heart rate noted  Respiratory: Normal respiratory effort, no problems with respiration noted  Abdomen: Soft, gravid, appropriate for gestational age.  Pain/Pressure: Present     Pelvic: Cervical exam deferred        Extremities: Normal range of motion.  Edema: Trace  Mental Status: Normal mood and affect. Normal behavior. Normal judgment and thought content.   Assessment and Plan:  Pregnancy: G1P0 at 4674w6d  1. Supervision of normal first pregnancy, antepartum Reviewed induction process  2. GBS (group B Streptococcus carrier), +RV culture, currently pregnant ppx in labor  Term labor symptoms and general obstetric precautions including but not limited to vaginal bleeding, contractions, leaking of fluid and fetal movement were reviewed in detail with the patient. Please refer to After Visit  Summary for other counseling recommendations.  Return in about 1 week (around 09/22/2017) for OB visit.  No future appointments.  Conan BowensKelly M Davis, MD

## 2017-09-23 ENCOUNTER — Telehealth (HOSPITAL_COMMUNITY): Payer: Self-pay | Admitting: *Deleted

## 2017-09-23 ENCOUNTER — Ambulatory Visit (INDEPENDENT_AMBULATORY_CARE_PROVIDER_SITE_OTHER): Payer: Medicaid Other | Admitting: Obstetrics and Gynecology

## 2017-09-23 ENCOUNTER — Encounter: Payer: Self-pay | Admitting: Obstetrics and Gynecology

## 2017-09-23 VITALS — BP 134/83 | HR 68 | Wt 205.3 lb

## 2017-09-23 DIAGNOSIS — Z3403 Encounter for supervision of normal first pregnancy, third trimester: Secondary | ICD-10-CM

## 2017-09-23 DIAGNOSIS — Z3400 Encounter for supervision of normal first pregnancy, unspecified trimester: Secondary | ICD-10-CM

## 2017-09-23 DIAGNOSIS — O99820 Streptococcus B carrier state complicating pregnancy: Secondary | ICD-10-CM

## 2017-09-23 NOTE — Progress Notes (Signed)
Subjective:  Misty Thompson is a 27 y.o. G1P0 at 5227w0d being seen today for ongoing prenatal care.  She is currently monitored for the following issues for this low-risk pregnancy and has Encounter for supervision of normal pregnancy, unspecified, unspecified trimester; Anemia in pregnancy; and GBS (group B Streptococcus carrier), +RV culture, currently pregnant on their problem list.  Patient reports general discomforts of pregnancy.  Contractions: Irregular. Vag. Bleeding: None.  Movement: Present. Denies leaking of fluid.   The following portions of the patient's history were reviewed and updated as appropriate: allergies, current medications, past family history, past medical history, past social history, past surgical history and problem list. Problem list updated.  Objective:   Vitals:   09/23/17 1040  BP: 134/83  Pulse: 68  Weight: 205 lb 4.8 oz (93.1 kg)    Fetal Status: Fetal Heart Rate (bpm): 140   Movement: Present     General:  Alert, oriented and cooperative. Patient is in no acute distress.  Skin: Skin is warm and dry. No rash noted.   Cardiovascular: Normal heart rate noted  Respiratory: Normal respiratory effort, no problems with respiration noted  Abdomen: Soft, gravid, appropriate for gestational age. Pain/Pressure: Present     Pelvic:  Cervical exam performed        Extremities: Normal range of motion.  Edema: Mild pitting, slight indentation  Mental Status: Normal mood and affect. Normal behavior. Normal judgment and thought content.   Urinalysis:      Assessment and Plan:  Pregnancy: G1P0 at 3527w0d  1. Supervision of normal first pregnancy, antepartum Labor precautions Not an out pt foley candidate by cervical exam today IOL to be scheduled BPP d/t post dates - US MFM FETAL BPP WO NON STRESS; Future  2. GBS (group B Streptococcus carrier), +RV culture, currently pregnant Tx while in labor  Term labor symptoms and general obstetric precautions including  but not limited to vaginal bleeding, contractions, leaking of fluid and fetal movement were reviewed in detail with the patient. Please refer to After Visit Summary for other counseling recommendations.  No follow-ups on file.   Hermina StaggersErvin, Michael L, MD

## 2017-09-23 NOTE — Telephone Encounter (Signed)
Preadmission screen  

## 2017-09-23 NOTE — Patient Instructions (Signed)
Vaginal Delivery Vaginal delivery means that you will give birth by pushing your baby out of your birth canal (vagina). A team of health care providers will help you before, during, and after vaginal delivery. Birth experiences are unique for every woman and every pregnancy, and birth experiences vary depending on where you choose to give birth. What should I do to prepare for my baby's birth? Before your baby is born, it is important to talk with your health care provider about:  Your labor and delivery preferences. These may include: ? Medicines that you may be given. ? How you will manage your pain. This might include non-medical pain relief techniques or injectable pain relief such as epidural analgesia. ? How you and your baby will be monitored during labor and delivery. ? Who may be in the labor and delivery room with you. ? Your feelings about surgical delivery of your baby (cesarean delivery, or C-section) if this becomes necessary. ? Your feelings about receiving donated blood through an IV tube (blood transfusion) if this becomes necessary.  Whether you are able: ? To take pictures or videos of the birth. ? To eat during labor and delivery. ? To move around, walk, or change positions during labor and delivery.  What to expect after your baby is born, such as: ? Whether delayed umbilical cord clamping and cutting is offered. ? Who will care for your baby right after birth. ? Medicines or tests that may be recommended for your baby. ? Whether breastfeeding is supported in your hospital or birth center. ? How long you will be in the hospital or birth center.  How any medical conditions you have may affect your baby or your labor and delivery experience.  To prepare for your baby's birth, you should also:  Attend all of your health care visits before delivery (prenatal visits) as recommended by your health care provider. This is important.  Prepare your home for your baby's  arrival. Make sure that you have: ? Diapers. ? Baby clothing. ? Feeding equipment. ? Safe sleeping arrangements for you and your baby.  Install a car seat in your vehicle. Have your car seat checked by a certified car seat installer to make sure that it is installed safely.  Think about who will help you with your new baby at home for at least the first several weeks after delivery.  What can I expect when I arrive at the birth center or hospital? Once you are in labor and have been admitted into the hospital or birth center, your health care provider may:  Review your pregnancy history and any concerns you have.  Insert an IV tube into one of your veins. This is used to give you fluids and medicines.  Check your blood pressure, pulse, temperature, and heart rate (vital signs).  Check whether your bag of water (amniotic sac) has broken (ruptured).  Talk with you about your birth plan and discuss pain control options.  Monitoring Your health care provider may monitor your contractions (uterine monitoring) and your baby's heart rate (fetal monitoring). You may need to be monitored:  Often, but not continuously (intermittently).  All the time or for long periods at a time (continuously). Continuous monitoring may be needed if: ? You are taking certain medicines, such as medicine to relieve pain or make your contractions stronger. ? You have pregnancy or labor complications.  Monitoring may be done by:  Placing a special stethoscope or a handheld monitoring device on your abdomen to   check your baby's heartbeat, and feeling your abdomen for contractions. This method of monitoring does not continuously record your baby's heartbeat or your contractions.  Placing monitors on your abdomen (external monitors) to record your baby's heartbeat and the frequency and length of contractions. You may not have to wear external monitors all the time.  Placing monitors inside of your uterus  (internal monitors) to record your baby's heartbeat and the frequency, length, and strength of your contractions. ? Your health care provider may use internal monitors if he or she needs more information about the strength of your contractions or your baby's heart rate. ? Internal monitors are put in place by passing a thin, flexible wire through your vagina and into your uterus. Depending on the type of monitor, it may remain in your uterus or on your baby's head until birth. ? Your health care provider will discuss the benefits and risks of internal monitoring with you and will ask for your permission before inserting the monitors.  Telemetry. This is a type of continuous monitoring that can be done with external or internal monitors. Instead of having to stay in bed, you are able to move around during telemetry. Ask your health care provider if telemetry is an option for you.  Physical exam Your health care provider may perform a physical exam. This may include:  Checking whether your baby is positioned: ? With the head toward your vagina (head-down). This is most common. ? With the head toward the top of your uterus (head-up or breech). If your baby is in a breech position, your health care provider may try to turn your baby to a head-down position so you can deliver vaginally. If it does not seem that your baby can be born vaginally, your provider may recommend surgery to deliver your baby. In rare cases, you may be able to deliver vaginally if your baby is head-up (breech delivery). ? Lying sideways (transverse). Babies that are lying sideways cannot be delivered vaginally.  Checking your cervix to determine: ? Whether it is thinning out (effacing). ? Whether it is opening up (dilating). ? How low your baby has moved into your birth canal.  What are the three stages of labor and delivery?  Normal labor and delivery is divided into the following three stages: Stage 1  Stage 1 is the  longest stage of labor, and it can last for hours or days. Stage 1 includes: ? Early labor. This is when contractions may be irregular, or regular and mild. Generally, early labor contractions are more than 10 minutes apart. ? Active labor. This is when contractions get longer, more regular, more frequent, and more intense. ? The transition phase. This is when contractions happen very close together, are very intense, and may last longer than during any other part of labor.  Contractions generally feel mild, infrequent, and irregular at first. They get stronger, more frequent (about every 2-3 minutes), and more regular as you progress from early labor through active labor and transition.  Many women progress through stage 1 naturally, but you may need help to continue making progress. If this happens, your health care provider may talk with you about: ? Rupturing your amniotic sac if it has not ruptured yet. ? Giving you medicine to help make your contractions stronger and more frequent.  Stage 1 ends when your cervix is completely dilated to 4 inches (10 cm) and completely effaced. This happens at the end of the transition phase. Stage 2  Once   your cervix is completely effaced and dilated to 4 inches (10 cm), you may start to feel an urge to push. It is common for the body to naturally take a rest before feeling the urge to push, especially if you received an epidural or certain other pain medicines. This rest period may last for up to 1-2 hours, depending on your unique labor experience.  During stage 2, contractions are generally less painful, because pushing helps relieve contraction pain. Instead of contraction pain, you may feel stretching and burning pain, especially when the widest part of your baby's head passes through the vaginal opening (crowning).  Your health care provider will closely monitor your pushing progress and your baby's progress through the vagina during stage 2.  Your  health care provider may massage the area of skin between your vaginal opening and anus (perineum) or apply warm compresses to your perineum. This helps it stretch as the baby's head starts to crown, which can help prevent perineal tearing. ? In some cases, an incision may be made in your perineum (episiotomy) to allow the baby to pass through the vaginal opening. An episiotomy helps to make the opening of the vagina larger to allow more room for the baby to fit through.  It is very important to breathe and focus so your health care provider can control the delivery of your baby's head. Your health care provider may have you decrease the intensity of your pushing, to help prevent perineal tearing.  After delivery of your baby's head, the shoulders and the rest of the body generally deliver very quickly and without difficulty.  Once your baby is delivered, the umbilical cord may be cut right away, or this may be delayed for 1-2 minutes, depending on your baby's health. This may vary among health care providers, hospitals, and birth centers.  If you and your baby are healthy enough, your baby may be placed on your chest or abdomen to help maintain the baby's temperature and to help you bond with each other. Some mothers and babies start breastfeeding at this time. Your health care team will dry your baby and help keep your baby warm during this time.  Your baby may need immediate care if he or she: ? Showed signs of distress during labor. ? Has a medical condition. ? Was born too early (prematurely). ? Had a bowel movement before birth (meconium). ? Shows signs of difficulty transitioning from being inside the uterus to being outside of the uterus. If you are planning to breastfeed, your health care team will help you begin a feeding. Stage 3  The third stage of labor starts immediately after the birth of your baby and ends after you deliver the placenta. The placenta is an organ that develops  during pregnancy to provide oxygen and nutrients to your baby in the womb.  Delivering the placenta may require some pushing, and you may have mild contractions. Breastfeeding can stimulate contractions to help you deliver the placenta.  After the placenta is delivered, your uterus should tighten (contract) and become firm. This helps to stop bleeding in your uterus. To help your uterus contract and to control bleeding, your health care provider may: ? Give you medicine by injection, through an IV tube, by mouth, or through your rectum (rectally). ? Massage your abdomen or perform a vaginal exam to remove any blood clots that are left in your uterus. ? Empty your bladder by placing a thin, flexible tube (catheter) into your bladder. ? Encourage   you to breastfeed your baby. After labor is over, you and your baby will be monitored closely to ensure that you are both healthy until you are ready to go home. Your health care team will teach you how to care for yourself and your baby. This information is not intended to replace advice given to you by your health care provider. Make sure you discuss any questions you have with your health care provider. Document Released: 11/06/2007 Document Revised: 08/17/2015 Document Reviewed: 02/11/2015 Elsevier Interactive Patient Education  2018 [employer]  

## 2017-09-24 ENCOUNTER — Telehealth (HOSPITAL_COMMUNITY): Payer: Self-pay | Admitting: *Deleted

## 2017-09-24 ENCOUNTER — Encounter (HOSPITAL_COMMUNITY): Payer: Self-pay | Admitting: *Deleted

## 2017-09-24 NOTE — Telephone Encounter (Signed)
Preadmission screen  

## 2017-09-25 ENCOUNTER — Other Ambulatory Visit: Payer: Self-pay | Admitting: Advanced Practice Midwife

## 2017-09-29 ENCOUNTER — Ambulatory Visit (INDEPENDENT_AMBULATORY_CARE_PROVIDER_SITE_OTHER): Payer: BLUE CROSS/BLUE SHIELD | Admitting: Family Medicine

## 2017-09-29 ENCOUNTER — Ambulatory Visit (HOSPITAL_BASED_OUTPATIENT_CLINIC_OR_DEPARTMENT_OTHER)
Admission: RE | Admit: 2017-09-29 | Discharge: 2017-09-29 | Disposition: A | Discharge status: Home / Self Care | Payer: BLUE CROSS/BLUE SHIELD | Source: Ambulatory Visit | Attending: Obstetrics and Gynecology | Admitting: Obstetrics and Gynecology

## 2017-09-29 ENCOUNTER — Encounter (HOSPITAL_COMMUNITY): Payer: Self-pay

## 2017-09-29 ENCOUNTER — Inpatient Hospital Stay (HOSPITAL_COMMUNITY)
Admission: AD | Admit: 2017-09-29 | Discharge: 2017-10-02 | DRG: 807 | Disposition: A | Discharge status: Home / Self Care | Payer: BLUE CROSS/BLUE SHIELD | Attending: Family Medicine | Admitting: Family Medicine

## 2017-09-29 VITALS — BP 135/79 | HR 76 | Wt 207.3 lb

## 2017-09-29 DIAGNOSIS — O99019 Anemia complicating pregnancy, unspecified trimester: Secondary | ICD-10-CM | POA: Diagnosis present

## 2017-09-29 DIAGNOSIS — O480 Post-term pregnancy: Secondary | ICD-10-CM

## 2017-09-29 DIAGNOSIS — O9902 Anemia complicating childbirth: Secondary | ICD-10-CM | POA: Diagnosis present

## 2017-09-29 DIAGNOSIS — Z3400 Encounter for supervision of normal first pregnancy, unspecified trimester: Secondary | ICD-10-CM

## 2017-09-29 DIAGNOSIS — O99213 Obesity complicating pregnancy, third trimester: Secondary | ICD-10-CM

## 2017-09-29 DIAGNOSIS — O99820 Streptococcus B carrier state complicating pregnancy: Secondary | ICD-10-CM

## 2017-09-29 DIAGNOSIS — E669 Obesity, unspecified: Secondary | ICD-10-CM | POA: Diagnosis present

## 2017-09-29 DIAGNOSIS — Z3490 Encounter for supervision of normal pregnancy, unspecified, unspecified trimester: Secondary | ICD-10-CM

## 2017-09-29 DIAGNOSIS — O99214 Obesity complicating childbirth: Secondary | ICD-10-CM | POA: Diagnosis present

## 2017-09-29 DIAGNOSIS — Z3A40 40 weeks gestation of pregnancy: Secondary | ICD-10-CM | POA: Insufficient documentation

## 2017-09-29 DIAGNOSIS — O99824 Streptococcus B carrier state complicating childbirth: Secondary | ICD-10-CM | POA: Diagnosis present

## 2017-09-29 DIAGNOSIS — D649 Anemia, unspecified: Secondary | ICD-10-CM | POA: Diagnosis present

## 2017-09-29 DIAGNOSIS — Z3403 Encounter for supervision of normal first pregnancy, third trimester: Secondary | ICD-10-CM

## 2017-09-29 LAB — CBC
HCT: 34.1 % — ABNORMAL LOW (ref 36.0–46.0)
HEMOGLOBIN: 11.4 g/dL — AB (ref 12.0–15.0)
MCH: 29.6 pg (ref 26.0–34.0)
MCHC: 33.4 g/dL (ref 30.0–36.0)
MCV: 88.6 fL (ref 78.0–100.0)
PLATELETS: 258 10*3/uL (ref 150–400)
RBC: 3.85 MIL/uL — ABNORMAL LOW (ref 3.87–5.11)
RDW: 14.1 % (ref 11.5–15.5)
WBC: 12.1 10*3/uL — ABNORMAL HIGH (ref 4.0–10.5)

## 2017-09-29 LAB — TYPE AND SCREEN
ABO/RH(D): O POS
ANTIBODY SCREEN: NEGATIVE

## 2017-09-29 LAB — ABO/RH: ABO/RH(D): O POS

## 2017-09-29 LAB — POCT FERN TEST: POCT FERN TEST: POSITIVE

## 2017-09-29 MED ORDER — LACTATED RINGERS IV SOLN
INTRAVENOUS | Status: DC
  Administered 2017-09-29 – 2017-09-30 (×3): via INTRAVENOUS

## 2017-09-29 MED ORDER — FENTANYL CITRATE (PF) 100 MCG/2ML IJ SOLN
100.0000 ug | INTRAMUSCULAR | Status: DC | PRN
  Administered 2017-09-29 – 2017-09-30 (×5): 100 ug via INTRAVENOUS
  Filled 2017-09-29 (×5): qty 2

## 2017-09-29 MED ORDER — FENTANYL CITRATE (PF) 100 MCG/2ML IJ SOLN: 100.0000 ug | INTRAMUSCULAR | Status: DC | PRN

## 2017-09-29 MED ORDER — ONDANSETRON HCL 4 MG/2ML IJ SOLN: 4.0000 mg | Freq: Four times a day (QID) | INTRAMUSCULAR | Status: DC | PRN

## 2017-09-29 MED ORDER — PENICILLIN G 3 MILLION UNITS IVPB - SIMPLE MED
3.0000 10*6.[IU] | INTRAVENOUS | Status: DC
  Administered 2017-09-30 (×3): 3 10*6.[IU] via INTRAVENOUS
  Filled 2017-09-29: qty 3
  Filled 2017-09-29: qty 100
  Filled 2017-09-29: qty 3
  Filled 2017-09-29 (×2): qty 100
  Filled 2017-09-29: qty 3
  Filled 2017-09-29 (×3): qty 100

## 2017-09-29 MED ORDER — SODIUM CHLORIDE 0.9 % IV SOLN
5.0000 10*6.[IU] | Freq: Once | INTRAVENOUS | Status: AC
  Administered 2017-09-29: 5 10*6.[IU] via INTRAVENOUS
  Filled 2017-09-29: qty 5

## 2017-09-29 NOTE — Progress Notes (Signed)
   PRENATAL VISIT NOTE  Subjective:  Misty Thompson is a 27 y.o. G1P0 at 6558w6d being seen today for ongoing prenatal care.  She is currently monitored for the following issues for this low-risk pregnancy and has Encounter for supervision of normal pregnancy, unspecified, unspecified trimester; Anemia in pregnancy; and GBS (group B Streptococcus carrier), +RV culture, currently pregnant on their problem list.  Patient reports no complaints.  Contractions: Irregular. Vag. Bleeding: None.  Movement: Present. Denies leaking of fluid.   The following portions of the patient's history were reviewed and updated as appropriate: allergies, current medications, past family history, past medical history, past social history, past surgical history and problem list. Problem list updated.  Objective:   Vitals:   09/29/17 1103  BP: 135/79  Pulse: 76  Weight: 207 lb 4.8 oz (94 kg)    Fetal Status: Fetal Heart Rate (bpm): NST   Movement: Present     General:  Alert, oriented and cooperative. Patient is in no acute distress.  Skin: Skin is warm and dry. No rash noted.   Cardiovascular: Normal heart rate noted  Respiratory: Normal respiratory effort, no problems with respiration noted  Abdomen: Soft, gravid, appropriate for gestational age.  Pain/Pressure: Present     Pelvic: Cervical exam deferred        Extremities: Normal range of motion.  Edema: Mild pitting, slight indentation  Mental Status:  Normal mood and affect. Normal behavior. Normal judgment and thought content.  Procedure: Patient informed of R/B/A of procedure. NST was performed and was reactive prior to procedure. NST:  EFM: Baseline: 140 bpm, Variability: Good {> 6 bpm), Accelerations: Reactive and Decelerations: Absent Toco: none Procedure done to begin ripening of the cervix prior to admission for induction of labor. Appropriate time out taken. The patient was placed in the lithotomy position and the cervix brought into view with  sterile speculum. A ring forcep was used to guide the 36F foley balloon through the internal os of the cervix. Foley Balloon filled with 60cc of sterile water. Plug inserted into end of the foley. Foley placed on tension and taped to medial thigh.  NST:  EFM Baseline: 140 bpm, Variability: Good {> 6 bpm), Accelerations: Reactive and Decelerations: Absent  Toco: regular, every 2-4 minutes There were no signs of tachysystole or hypertonus. All equipment was removed and accounted for. The patient tolerated the procedure well.  Assessment and Plan:  Pregnancy: G1P0 at 1858w6d 1. Supervision of normal first pregnancy, antepartum   2. GBS (group B Streptococcus carrier), +RV culture, currently pregnant For treatment in labor   S/p Outpatient placement of foley balloon catheter for cervical ripening. Induction of labor scheduled for tomorrow at 1200 am. Reassuring FHR tracing with no concerns at present. Warning signs given to patient to include return to MAU for heavy vaginal bleeding, Rupture of membranes, painful uterine contractions q 5 mins or less, severe abdominal discomfort, decreased fetal movement.  Return in 4 weeks (on 10/27/2017).   Reva Boresanya S Aylen Stradford, MD 09/29/2017 2:30 PM

## 2017-09-29 NOTE — MAU Note (Signed)
Pt reports contractions and ROM at [id number]

## 2017-09-29 NOTE — Progress Notes (Signed)
Pt is G1P0 3147w6d here for foley bulb insertion.

## 2017-09-29 NOTE — H&P (Addendum)
OBSTETRIC ADMISSION HISTORY AND PHYSICAL  Misty Thompson is a 27 y.o. female G1P0 with IUP at 3945w6d by lmp presenting for SROM after placement of outpatient foley bulb.   Reports fetal movement. Denies vaginal bleeding.  [facility]  Support person in labor: mom/sister  Ultrasounds . Anatomy U/S: normal @19w   Prenatal History/Complications: Marland Kitchen. GBS pos  Past Medical History: Past Medical History:  Diagnosis Date  . Medical history non-contributory   . Right ureteral stone   . Wears contact lenses     Past Surgical History: Past Surgical History:  Procedure Laterality Date  . CYSTOSCOPY WITH RETROGRADE PYELOGRAM, URETEROSCOPY AND STENT PLACEMENT Right 08/05/2013   Procedure: CYSTOSCOPY WITH RETROGRADE PYELOGRAM, URETEROSCOPY AND STENT PLACEMENT;  Surgeon: Danae ChenMarc H Nesi, MD;  [facility];  Service: Urology;  Laterality: Right;  . WISDOM TOOTH EXTRACTION  MARCH 2015    Obstetrical History: OB History    Gravida  1   Para      Term      Preterm      AB      Living  0     SAB      TAB      Ectopic      Multiple      Live Births              Social History: Social History   Socioeconomic History  . Marital status: Single    Spouse name: Not on file  . Number of children: Not on file  . Years of education: Not on file  . Highest education level: Not on file  Occupational History  . Not on file  Social Needs  . Financial resource strain: Not hard at all  . Food insecurity:    Worry: Never true    Inability: Never true  . Transportation needs:    Medical: No    Non-medical: No  Tobacco Use  . Smoking status: Never Smoker  . Smokeless tobacco: Never Used  Substance and Sexual Activity  . Alcohol use: Not Currently  . Drug use: No  . Sexual activity: Yes    Partners: Male    Birth control/protection: None  Lifestyle  . Physical activity:    Days per week: 1 day    Minutes per session: 30  min  . Stress: Not at all  Relationships  . Social connections:    Talks on phone: Not on file    Gets together: Not on file    Attends religious service: Not on file    Active member of club or organization: Not on file    Attends meetings of clubs or organizations: Not on file    Relationship status: Not on file  Other Topics Concern  . Not on file  Social History Narrative  . Not on file    Family History: Family History  Problem Relation Age of Onset  . Hypertension Mother   . Diabetes Maternal Grandfather     Allergies: No Known Allergies  Medications Prior to Admission  Medication Sig Dispense Refill Last Dose  . Prenatal Vit-Fe Phos-FA-Omega (VITAFOL GUMMIES) 3.33-0.333-34.8 MG CHEW Chew 3 each by mouth daily. 90 tablet 11 Taking  . Prenatal-DSS-FeCb-FeGl-FA (CITRANATAL BLOOM) 90-1 MG TABS Take 1 tablet by mouth daily. 30 tablet 12 Taking  . promethazine (PHENERGAN) 25 MG tablet Take 1 tablet (25 mg total) by mouth every 6 (six) hours as needed for nausea. (Patient not taking:  Reported on 09/29/2017) 20 tablet 0 Not Taking     Review of Systems  All systems reviewed and negative except as stated in HPI  Blood pressure (!) 144/87, pulse 74, temperature 98.4 F (36.9 C), temperature source Oral, resp. rate 16, weight 93.4 kg, last menstrual period 12/17/2016, SpO2 98 %. General appearance: alert, cooperative and no distress Lungs: no respiratory distress Heart: regular rate, murmur Abdomen: soft, non-tender; gravid b Pelvic: Did not repeat cervical exam as done by nurse <1hr prior Extremities: no indication of DVT Presentation: unsure Fetal monitoring: reassuring, appropriate variation Uterine activity: intermittent contractions Dilation: 2(external os 4) Effacement (%): 50 Station: -3 Exam by:: Latricia HeftAnna CIoce, RN  Prenatal labs: ABO, Rh: O/Positive/-- (02/11 1435) Antibody: Negative (02/11 1435) Rubella: 3.52 (02/11 1435) RPR: Non Reactive (05/20 1141)   HBsAg: Negative (02/11 1435)  HIV: Non Reactive (05/20 1141)  GBS: Positive (07/22 0939)  Glucola: 1hr: 126 Genetic screening:  NIPS: Panorama - Low risk female  Prenatal Transfer Tool  Maternal Diabetes: No Genetic Screening: Normal Maternal Ultrasounds/Referrals: Normal Fetal Ultrasounds or other Referrals:  None Maternal Substance Abuse:  No Significant Maternal Medications:  None Significant Maternal Lab Results: Lab values include: Group B Strep positive  Results for orders placed or performed during the hospital encounter of 09/29/17 (from the past 24 hour(s))  POCT fern test   Collection Time: 09/29/17  7:11 PM  Result Value Ref Range   POCT Fern Test Positive = ruptured amniotic membanes   CBC   Collection Time: 09/29/17  8:23 PM  Result Value Ref Range   WBC 12.1 (H) 4.0 - 10.5 K/uL   RBC 3.85 (L) 3.87 - 5.11 MIL/uL   Hemoglobin 11.4 (L) 12.0 - 15.0 g/dL   HCT 16.134.1 (L) 09.636.0 - 04.546.0 %   MCV 88.6 78.0 - 100.0 fL   MCH 29.6 26.0 - 34.0 pg   MCHC 33.4 30.0 - 36.0 g/dL   RDW 40.914.1 81.111.5 - 91.415.5 %   Platelets 258 150 - 400 K/uL    Patient Active Problem List   Diagnosis Date Noted  . GBS (group B Streptococcus carrier), +RV culture, currently pregnant 09/04/2017  . Anemia in pregnancy 07/01/2017  . Encounter for supervision of normal pregnancy, unspecified, unspecified trimester 03/23/2017    Assessment/Plan:  Misty Thompson is a 27 y.o. G1P0 at 7534w6d here for SROM  Labor:  -- pain control: nitrous while in MAU at patient's request, would like epidural when moved to birthing suites  Fetal Wellbeing: . -- GBS (pos), pcn ordered -- continuous fetal monitoring -    Postpartum Planning -- breast --Bc by pills  Marthenia RollingScott Bland, DO  Attestation: I agree with the resident's documentation. Patient awaiting admission to labor and will plan to start pitocin on arrival. I have separately seen and examined the patient.   Cristal DeerLaurel S. Earlene PlaterWallace, DO OB/GYN Fellow

## 2017-09-29 NOTE — Patient Instructions (Addendum)

## 2017-09-30 ENCOUNTER — Other Ambulatory Visit: Payer: Self-pay

## 2017-09-30 ENCOUNTER — Inpatient Hospital Stay (HOSPITAL_COMMUNITY): Payer: BLUE CROSS/BLUE SHIELD | Admitting: Anesthesiology

## 2017-09-30 ENCOUNTER — Encounter (HOSPITAL_COMMUNITY): Payer: Self-pay

## 2017-09-30 ENCOUNTER — Inpatient Hospital Stay (HOSPITAL_COMMUNITY)
Admission: RE | Admit: 2017-09-30 | Discharge: 2017-09-30 | Disposition: A | Discharge status: Home / Self Care | Payer: Medicaid Other | Source: Ambulatory Visit | Attending: Obstetrics and Gynecology | Admitting: Obstetrics and Gynecology

## 2017-09-30 DIAGNOSIS — O9902 Anemia complicating childbirth: Secondary | ICD-10-CM | POA: Diagnosis present

## 2017-09-30 DIAGNOSIS — O99214 Obesity complicating childbirth: Secondary | ICD-10-CM | POA: Diagnosis present

## 2017-09-30 DIAGNOSIS — E669 Obesity, unspecified: Secondary | ICD-10-CM | POA: Diagnosis present

## 2017-09-30 DIAGNOSIS — O480 Post-term pregnancy: Secondary | ICD-10-CM

## 2017-09-30 DIAGNOSIS — Z3490 Encounter for supervision of normal pregnancy, unspecified, unspecified trimester: Secondary | ICD-10-CM

## 2017-09-30 DIAGNOSIS — O99824 Streptococcus B carrier state complicating childbirth: Secondary | ICD-10-CM

## 2017-09-30 DIAGNOSIS — Z3A40 40 weeks gestation of pregnancy: Secondary | ICD-10-CM

## 2017-09-30 DIAGNOSIS — D649 Anemia, unspecified: Secondary | ICD-10-CM | POA: Diagnosis present

## 2017-09-30 LAB — RPR: RPR: NONREACTIVE

## 2017-09-30 MED ORDER — PHENYLEPHRINE 40 MCG/ML (10ML) SYRINGE FOR IV PUSH (FOR BLOOD PRESSURE SUPPORT)
80.0000 ug | PREFILLED_SYRINGE | INTRAVENOUS | Status: DC | PRN
  Filled 2017-09-30: qty 10
  Filled 2017-09-30: qty 5

## 2017-09-30 MED ORDER — OXYTOCIN 40 UNITS IN LACTATED RINGERS INFUSION - SIMPLE MED
1.0000 m[IU]/min | INTRAVENOUS | Status: DC
  Administered 2017-09-30: 2 m[IU]/min via INTRAVENOUS

## 2017-09-30 MED ORDER — ZOLPIDEM TARTRATE 5 MG PO TABS: 5.0000 mg | ORAL_TABLET | Freq: Every evening | ORAL | Status: DC | PRN

## 2017-09-30 MED ORDER — LACTATED RINGERS IV SOLN: 500.0000 mL | Freq: Once | INTRAVENOUS | Status: DC

## 2017-09-30 MED ORDER — OXYCODONE-ACETAMINOPHEN 5-325 MG PO TABS: 2.0000 | ORAL_TABLET | ORAL | Status: DC | PRN

## 2017-09-30 MED ORDER — LACTATED RINGERS IV SOLN: 500.0000 mL | INTRAVENOUS | Status: DC | PRN

## 2017-09-30 MED ORDER — LIDOCAINE HCL (PF) 1 % IJ SOLN
30.0000 mL | INTRAMUSCULAR | Status: DC | PRN
  Filled 2017-09-30: qty 30

## 2017-09-30 MED ORDER — OXYTOCIN BOLUS FROM INFUSION
500.0000 mL | Freq: Once | INTRAVENOUS | Status: AC
  Administered 2017-09-30: 500 mL via INTRAVENOUS

## 2017-09-30 MED ORDER — SOD CITRATE-CITRIC ACID 500-334 MG/5ML PO SOLN: 30.0000 mL | ORAL | Status: DC | PRN

## 2017-09-30 MED ORDER — IBUPROFEN 600 MG PO TABS
600.0000 mg | ORAL_TABLET | Freq: Four times a day (QID) | ORAL | Status: DC
  Administered 2017-09-30 – 2017-10-02 (×7): 600 mg via ORAL
  Filled 2017-09-30 (×8): qty 1

## 2017-09-30 MED ORDER — EPHEDRINE 5 MG/ML INJ
10.0000 mg | INTRAVENOUS | Status: DC | PRN
  Filled 2017-09-30: qty 2

## 2017-09-30 MED ORDER — TERBUTALINE SULFATE 1 MG/ML IJ SOLN
0.2500 mg | Freq: Once | INTRAMUSCULAR | Status: DC | PRN
  Filled 2017-09-30: qty 1

## 2017-09-30 MED ORDER — ACETAMINOPHEN 325 MG PO TABS: 650.0000 mg | ORAL_TABLET | ORAL | Status: DC | PRN

## 2017-09-30 MED ORDER — FENTANYL 2.5 MCG/ML BUPIVACAINE 1/10 % EPIDURAL INFUSION (WH - ANES)
14.0000 mL/h | INTRAMUSCULAR | Status: DC | PRN
  Administered 2017-09-30: 12 mL/h via EPIDURAL
  Administered 2017-09-30: 14 mL/h via EPIDURAL
  Filled 2017-09-30 (×2): qty 100

## 2017-09-30 MED ORDER — TETANUS-DIPHTH-ACELL PERTUSSIS 5-2.5-18.5 LF-MCG/0.5 IM SUSP: 0.5000 mL | Freq: Once | INTRAMUSCULAR | Status: DC

## 2017-09-30 MED ORDER — COCONUT OIL OIL: 1.0000 "application " | TOPICAL_OIL | Status: DC | PRN

## 2017-09-30 MED ORDER — ONDANSETRON HCL 4 MG/2ML IJ SOLN: 4.0000 mg | INTRAMUSCULAR | Status: DC | PRN

## 2017-09-30 MED ORDER — SIMETHICONE 80 MG PO CHEW: 80.0000 mg | CHEWABLE_TABLET | ORAL | Status: DC | PRN

## 2017-09-30 MED ORDER — SENNOSIDES-DOCUSATE SODIUM 8.6-50 MG PO TABS
2.0000 | ORAL_TABLET | ORAL | Status: DC
  Administered 2017-10-01 (×2): 2 via ORAL
  Filled 2017-09-30 (×2): qty 2

## 2017-09-30 MED ORDER — PRENATAL MULTIVITAMIN CH
1.0000 | ORAL_TABLET | Freq: Every day | ORAL | Status: DC
  Administered 2017-10-01 – 2017-10-02 (×2): 1 via ORAL
  Filled 2017-09-30 (×2): qty 1

## 2017-09-30 MED ORDER — BENZOCAINE-MENTHOL 20-0.5 % EX AERO: 1.0000 "application " | INHALATION_SPRAY | CUTANEOUS | Status: DC | PRN

## 2017-09-30 MED ORDER — OXYCODONE-ACETAMINOPHEN 5-325 MG PO TABS: 1.0000 | ORAL_TABLET | ORAL | Status: DC | PRN

## 2017-09-30 MED ORDER — OXYTOCIN 40 UNITS IN LACTATED RINGERS INFUSION - SIMPLE MED
2.5000 [IU]/h | INTRAVENOUS | Status: DC
  Filled 2017-09-30: qty 1000

## 2017-09-30 MED ORDER — LIDOCAINE HCL (PF) 1 % IJ SOLN
INTRAMUSCULAR | Status: DC | PRN
  Administered 2017-09-30 (×2): 4 mL via EPIDURAL

## 2017-09-30 MED ORDER — PHENYLEPHRINE 40 MCG/ML (10ML) SYRINGE FOR IV PUSH (FOR BLOOD PRESSURE SUPPORT)
80.0000 ug | PREFILLED_SYRINGE | INTRAVENOUS | Status: DC | PRN
  Filled 2017-09-30: qty 5

## 2017-09-30 MED ORDER — ONDANSETRON HCL 4 MG PO TABS: 4.0000 mg | ORAL_TABLET | ORAL | Status: DC | PRN

## 2017-09-30 MED ORDER — DIBUCAINE 1 % RE OINT: 1.0000 "application " | TOPICAL_OINTMENT | RECTAL | Status: DC | PRN

## 2017-09-30 MED ORDER — DIPHENHYDRAMINE HCL 50 MG/ML IJ SOLN: 12.5000 mg | INTRAMUSCULAR | Status: DC | PRN

## 2017-09-30 MED ORDER — WITCH HAZEL-GLYCERIN EX PADS: 1.0000 "application " | MEDICATED_PAD | CUTANEOUS | Status: DC | PRN

## 2017-09-30 MED ORDER — DIPHENHYDRAMINE HCL 25 MG PO CAPS: 25.0000 mg | ORAL_CAPSULE | Freq: Four times a day (QID) | ORAL | Status: DC | PRN

## 2017-09-30 NOTE — Anesthesia Procedure Notes (Signed)
Epidural Patient location during procedure: OB Start time: 09/30/2017 7:41 AM  Staffing Anesthesiologist: Mal AmabileFoster, Tonya Carlile, MD Performed: anesthesiologist   Preanesthetic Checklist Completed: patient identified, site marked, surgical consent, pre-op evaluation, timeout performed, IV checked, risks and benefits discussed and monitors and equipment checked  Epidural Patient position: sitting Prep: site prepped and draped and DuraPrep Patient monitoring: continuous pulse ox and blood pressure Approach: midline Location: L3-L4 Injection technique: LOR air  Needle:  Needle type: Tuohy  Needle gauge: 17 G Needle length: 9 cm and 9 Needle insertion depth: 6 cm Catheter type: closed end flexible Catheter size: 19 Gauge Catheter at skin depth: 11 cm Test dose: negative and Other  Assessment Events: blood not aspirated, injection not painful, no injection resistance, negative IV test and no paresthesia  Additional Notes Patient identified. Risks and benefits discussed including failed block, incomplete  Pain control, post dural puncture headache, nerve damage, paralysis, blood pressure Changes, nausea, vomiting, reactions to medications-both toxic and allergic and post Partum back pain. All questions were answered. Patient expressed understanding and wished to proceed. Sterile technique was used throughout procedure. Epidural site was Dressed with sterile barrier dressing. No paresthesias, signs of intravascular injection Or signs of intrathecal spread were encountered.  Patient was more comfortable after the epidural was dosed. Please see RN's note for documentation of vital signs and FHR which are stable.

## 2017-09-30 NOTE — Progress Notes (Signed)
OB/GYN Faculty Practice: Labor Progress Note  Subjective: Strip note. Patient still in MAU. Unable to start augmentation until on labor floor.   Objective: BP (!) 144/87   Pulse 74   Temp 98.4 F (36.9 C) (Oral)   Resp 16   Wt 93.4 kg   LMP 12/17/2016   SpO2 98%   BMI 37.68 kg/m  Dilation: 2(external os 4) Effacement (%): 50 Cervical Position: Posterior Station: -3 Presentation: Vertex Exam by:: Latricia HeftAnna CIoce, RN  Assessment and Plan: 27 y.o. G1P0 67110w0d admitted for SROM.   Labor: Latent labor. -- s/p outpatient foley bulb for post-dates -- SROM 1820 clear fluid -- will plan to start pitocin once on labor floor -- pain control: planning for epidural, currently using nitrous  Fetal Well-Being: Reported to be cephalic by sutures. Will recheck position on arrival to labor unit -- Category I - intermittent fetal monitoring permitted, low risk -- GBS (+) - PCN  (2040)  Morrie Daywalt S. Earlene PlaterWallace, DO OB/GYN Fellow, Faculty Practice  1:35 AM

## 2017-09-30 NOTE — Progress Notes (Signed)
Pt took belts off while up to the bathroom.

## 2017-09-30 NOTE — Progress Notes (Signed)
LABOR PROGRESS NOTE  Misty Thompson is a 27 y.o. G1P0 at 7534w0d  admitted for IOL for postdates   Subjective: Patient comfortable with epidural   Objective: BP (!) 111/58   Pulse 71   Temp 98.1 F (36.7 C) (Axillary)   Resp 18   Ht 5\' 2"  (1.575 m)   Wt 93.4 kg   LMP 12/17/2016   SpO2 98%   BMI 37.68 kg/m  or  Vitals:   09/30/17 0931 09/30/17 1001 09/30/17 1031 09/30/17 1101  BP: 126/67 135/72 (!) 115/50 (!) 111/58  Pulse: 70 77 68 71  Resp:  18    Temp:  98.1 F (36.7 C)    TempSrc:  Axillary    SpO2:      Weight:      Height:        Pitocin augmentation initiated  Dilation: 7.5 Effacement (%): 80 Cervical Position: Middle Station: -2 Presentation: Vertex Exam by:: Milus GlazierJennifer Hamilton RN FHT: baseline rate 140, moderate varibility, +acel, no decel Toco: 2-6 minutes   Labs: Lab Results  Component Value Date   WBC 12.1 (H) 09/29/2017   HGB 11.4 (L) 09/29/2017   HCT 34.1 (L) 09/29/2017   MCV 88.6 09/29/2017   PLT 258 09/29/2017    Patient Active Problem List   Diagnosis Date Noted  . Pregnancy 09/30/2017  . GBS (group B Streptococcus carrier), +RV culture, currently pregnant 09/04/2017  . Anemia in pregnancy 07/01/2017  . Encounter for supervision of normal pregnancy, unspecified, unspecified trimester 03/23/2017    Assessment / Plan: 27 y.o. G1P0 at 7634w0d here for IOL for PD   Labor: Pitocin augmentation  Fetal Wellbeing:  Cat I  Pain Control:  Epidural  Anticipated MOD:  SVD  Sharyon CableRogers, Taseen Marasigan C, CNM 09/30/2017, 11:04 AM

## 2017-09-30 NOTE — Anesthesia Pain Management Evaluation Note (Signed)
  CRNA Pain Management Visit Note  Patient: Misty Thompson, 27 y.o., female  "Hello [facility] am a [facility]. We have an anesthesia team available at all times to provide care throughout the hospital, including epidural management and anesthesia for C-section. I don't know your plan for the delivery whether it a natural birth, water birth, IV sedation, nitrous supplementation, doula or epidural, but we want to meet your pain goals."   1.Was your pain managed to your expectations on prior hospitalizations?   No prior hospitalizations  2.What is your expectation for pain management during this hospitalization?     Epidural  3.How can we help you reach that goal? unsure  Record the patient's initial score and the patient's pain goal.   Pain: 3  Pain Goal: 10 [facility] wants you to be able to say your pain was always managed very well.  Cephus ShellingBURGER,Dontavion Noxon 09/30/2017

## 2017-09-30 NOTE — Anesthesia Preprocedure Evaluation (Signed)
Anesthesia Evaluation  Patient identified by MRN, date of birth, ID band Patient awake    Reviewed: Allergy & Precautions, Patient's Chart, lab work & pertinent test results  Airway Mallampati: II  TM Distance: >3 FB Neck ROM: Full    Dental no notable dental hx. (+) Teeth Intact   Pulmonary neg pulmonary ROS,    Pulmonary exam normal breath sounds clear to auscultation       Cardiovascular negative cardio ROS Normal cardiovascular exam Rhythm:Regular Rate:Normal     Neuro/Psych negative neurological ROS  negative psych ROS   GI/Hepatic GERD  ,  Endo/Other  Obesity  Renal/GU Renal diseaseHx/o renal calculus  negative genitourinary   Musculoskeletal negative musculoskeletal ROS (+)   Abdominal (+) + obese,   Peds  Hematology  (+) anemia ,   Anesthesia Other Findings   Reproductive/Obstetrics (+) Pregnancy                             Anesthesia Physical Anesthesia Plan  ASA: II  Anesthesia Plan: Epidural   Post-op Pain Management:    Induction:   PONV Risk Score and Plan:   Airway Management Planned: Natural Airway  Additional Equipment:   Intra-op Plan:   Post-operative Plan:   Informed Consent: I have reviewed the patients History and Physical, chart, labs and discussed the procedure including the risks, benefits and alternatives for the proposed anesthesia with the patient or authorized representative who has indicated his/her understanding and acceptance.     Plan Discussed with: Anesthesiologist  Anesthesia Plan Comments:         Anesthesia Quick Evaluation

## 2017-10-01 NOTE — Anesthesia Postprocedure Evaluation (Signed)
Anesthesia Post Note  Patient: Misty Thompson  Procedure(s) Performed: AN AD HOC LABOR EPIDURAL     Patient location during evaluation: Mother Baby Anesthesia Type: Epidural Level of consciousness: awake and alert Pain management: pain level controlled Vital Signs Assessment: post-procedure vital signs reviewed and stable Respiratory status: spontaneous breathing, nonlabored ventilation and respiratory function stable Cardiovascular status: stable Postop Assessment: no headache, no backache and epidural receding Anesthetic complications: no    Last Vitals:  Vitals:   10/01/17 0055 10/01/17 0519  BP: 137/70 126/60  Pulse: 66 64  Resp: 16 15  Temp: 36.6 C 36.6 C  SpO2: 98%     Last Pain:  Vitals:   10/01/17 0521  TempSrc:   PainSc: 0-No pain   Pain Goal: Patients Stated Pain Goal: 0 (09/30/17 0542)               Marrion CoyMERRITT,Aylee Littrell

## 2017-10-01 NOTE — Lactation Note (Signed)
This note was copied from a baby's chart. Lactation Consultation Note  Patient Name: Misty Thompson Today's Date: 10/01/2017   Per [name] RN mother is exclusively formula feeding.      Maternal Data    Feeding    LATCH Score                   Interventions    Lactation Tools Discussed/Used     Consult Status      [name], [name] 10/01/2017, 3:06 PM

## 2017-10-02 ENCOUNTER — Encounter (HOSPITAL_COMMUNITY): Payer: Self-pay | Admitting: *Deleted

## 2017-10-02 MED ORDER — ACETAMINOPHEN 325 MG PO TABS: 650.0000 mg | ORAL_TABLET | ORAL | 0 refills | Status: AC | PRN

## 2017-10-02 MED ORDER — IBUPROFEN 600 MG PO TABS: 600.0000 mg | ORAL_TABLET | Freq: Four times a day (QID) | ORAL | 0 refills | Status: DC

## 2017-10-02 NOTE — Discharge Summary (Addendum)
  OB Discharge Summary     Patient Name: Misty Thompson DOB: 04/11/1990 MRN: [id number]  Date of admission: 09/29/2017 Delivering MD: NEWTON, KIMBERLY NILES   Date of discharge: 10/02/2017  Admitting diagnosis: 40WKS WATER BROKE Intrauterine pregnancy: [redacted]w[redacted]d     Secondary diagnosis:  Active Problems:   Encounter for supervision of normal pregnancy, unspecified, unspecified trimester   Anemia in pregnancy   GBS (group B Streptococcus carrier), +RV culture, currently pregnant   Pregnancy   SVD (spontaneous vaginal delivery)  Additional problems:      Discharge diagnosis: Term Pregnancy Delivered                                                                                                Post partum procedures:n/a  Augmentation: Pitocin  Complications: None  Hospital course:  Induction of Labor With Vaginal Delivery   26 y.o. yo G1P0 at [redacted]w[redacted]d was admitted to the hospital 09/29/2017 for induction of labor.  Indication for induction: Postdates.  Patient had an uncomplicated labor course as follows: Membrane Rupture Time/Date: 6:20 PM ,09/29/2017   Intrapartum Procedures: Episiotomy: None [1]                                         Lacerations:  None [1]  Patient had delivery of a Viable infant.  Information for the patient's newborn:  Sandlin, Girl Janeann [[id number]]  Delivery Method: Vaginal, Spontaneous(Filed from Delivery Summary)   09/30/2017  Details of delivery can be found in separate delivery note.  Patient had a routine postpartum course. Patient is discharged home 10/02/17.  Physical exam  Vitals:   10/01/17 0519 10/01/17 1447 10/01/17 2300 10/02/17 0500  BP: 126/60 129/76 138/82 136/77  Pulse: 64 (!) 56 64 (!) 57  Resp: 15 18 18   Temp: 97.8 F (36.6 C) 98.5 F (36.9 C) 98 F (36.7 C) 98 F (36.7 C)  TempSrc: Oral Oral Oral Oral  SpO2:      Weight:      Height:       General: alert, cooperative and no distress Lochia: appropriate Uterine Fundus:  firm Incision: N/A DVT Evaluation: No evidence of DVT seen on physical exam. Labs: Lab Results  Component Value Date   WBC 12.1 (H) 09/29/2017   HGB 11.4 (L) 09/29/2017   HCT 34.1 (L) 09/29/2017   MCV 88.6 09/29/2017   PLT 258 09/29/2017   CMP Latest Ref Rng & Units 01/15/2017  Glucose 65 - 99 mg/dL 93  BUN 6 - 20 mg/dL 11  Creatinine 0.44 - 1.00 mg/dL 0.69  Sodium 135 - 145 mmol/L 134(L)  Potassium 3.5 - 5.1 mmol/L 3.9  Chloride 101 - 111 mmol/L 104  CO2 22 - 32 mmol/L 24  Calcium 8.9 - 10.3 mg/dL 9.5  Total Protein 6.5 - 8.1 g/dL 7.2  Total Bilirubin 0.3 - 1.2 mg/dL 0.1(L)  Alkaline Phos 38 - 126 U/L 63  AST 15 - 41 U/L 19  ALT 14 - 54 U/L 14      Discharge instruction: per After Visit Summary and "Baby and Me Booklet".  After visit meds:  Allergies as of 10/02/2017   No Known Allergies     Medication List    STOP taking these medications   CITRANATAL BLOOM 90-1 MG Tabs   promethazine 25 MG tablet Commonly known as:  PHENERGAN     TAKE these medications   acetaminophen 325 MG tablet Commonly known as:  TYLENOL Take 2 tablets (650 mg total) by mouth every 4 (four) hours as needed for up to 15 days (for pain scale < 4).   ibuprofen 600 MG tablet Commonly known as:  ADVIL,MOTRIN Take 1 tablet (600 mg total) by mouth every 6 (six) hours.   VITAFOL GUMMIES 3.33-0.333-34.8 MG Chew Chew 3 each by mouth daily.       Diet: routine diet  Activity: Advance as tolerated. Pelvic rest for 6 weeks.   Outpatient follow up:Marland Kitchen Follow up Appt: Future Appointments  Date Time Provider [place]  10/28/2017 10:00 AM Anyanwu, Sallyanne Havers, MD [place] None   Follow up Visit:No follow-ups on file.  Postpartum contraception: Combination OCPs  Newborn Data: Live born female  Birth Weight: 6 lb 9.3 oz (2985 g) APGAR: 7, 9  Newborn Delivery   Birth date/time:  09/30/2017 14:15:00 Delivery type:  Vaginal, Spontaneous     Baby Feeding: Breast Disposition:home with  mother   10/02/2017 Sherene Sires, DO   I have spoken with and examined this patient and agree with resident/PA-S/Med-S/SNM's note and plan of care. VSS, HRR&R, Resp unlabored, Legs neg.  Nigel Berthold, CNM 10/06/2017 2:54 PM

## 2017-10-28 ENCOUNTER — Encounter: Payer: Self-pay | Admitting: Obstetrics & Gynecology

## 2017-10-28 ENCOUNTER — Ambulatory Visit: Payer: BLUE CROSS/BLUE SHIELD | Admitting: Obstetrics & Gynecology

## 2017-10-28 ENCOUNTER — Ambulatory Visit (INDEPENDENT_AMBULATORY_CARE_PROVIDER_SITE_OTHER): Payer: BLUE CROSS/BLUE SHIELD | Admitting: Obstetrics & Gynecology

## 2017-10-28 DIAGNOSIS — Z1389 Encounter for screening for other disorder: Secondary | ICD-10-CM | POA: Diagnosis not present

## 2017-10-28 DIAGNOSIS — Z3042 Encounter for surveillance of injectable contraceptive: Secondary | ICD-10-CM | POA: Diagnosis not present

## 2017-10-28 DIAGNOSIS — O165 Unspecified maternal hypertension, complicating the puerperium: Secondary | ICD-10-CM

## 2017-10-28 MED ORDER — ENALAPRIL MALEATE 5 MG PO TABS: 5.0000 mg | ORAL_TABLET | Freq: Every day | ORAL | 1 refills | Status: DC

## 2017-10-28 MED ORDER — MEDROXYPROGESTERONE ACETATE 150 MG/ML IM SUSP
150.0000 mg | Freq: Once | INTRAMUSCULAR | Status: AC
  Administered 2018-01-21: 150 mg via INTRAMUSCULAR

## 2017-10-28 MED ORDER — MEDROXYPROGESTERONE ACETATE 150 MG/ML IM SUSP
150.0000 mg | Freq: Once | INTRAMUSCULAR | Status: AC
  Administered 2017-10-28: 150 mg via INTRAMUSCULAR

## 2017-10-28 MED ORDER — ASPIRIN EC 81 MG PO TBEC: 81.0000 mg | DELAYED_RELEASE_TABLET | Freq: Every day | ORAL | 2 refills | Status: DC

## 2017-10-28 NOTE — Patient Instructions (Signed)
Return to clinic for any scheduled appointments or for any gynecologic concerns as needed.   

## 2017-10-28 NOTE — Progress Notes (Signed)
    Post Partum Exam  Misty Thompson is a 27 y.o. G1P0 female who presents for a postpartum visit. She is 4 weeks postpartum following a spontaneous vaginal delivery. I have fully reviewed the prenatal and intrapartum course. The delivery was at 40.6 gestational weeks.  Anesthesia: epidural. Postpartum course has been good. Baby's course has been good. Baby is feeding by bottle - Lucien MonsGerber Good Start. Bleeding staining only. Bowel function is normal. Bladder function is normal. Patient is not sexually active. Contraception method is none, desires OCPs. Postpartum depression screening:negative. Denies any headaches, visual symptoms, RUQ/epigastric pain.  The following portions of the patient's history were reviewed and updated as appropriate: allergies, current medications, past family history, past medical history, past social history, past surgical history and problem list. Last pap smear done 03/23/17 and was Normal  Review of Systems Pertinent items noted in HPI and remainder of comprehensive ROS otherwise negative.    Objective:  Blood pressure (!) 142/89, pulse 81, weight 189 lb 1.6 oz (85.8 kg), not currently breastfeeding.  Initial BP today 149/90.  General:  alert and no distress   Breasts:  deferred  Lungs: clear to auscultation bilaterally  Heart:  regular rate and rhythm  Abdomen: soft, non-tender; bowel sounds normal; no masses,  no organomegaly  Pelvic:  not evaluated        Assessment and Plan:  1. Postpartum hypertension No antenatal or intrapartum HTN. No preeclampsia symptoms. Will check labs, and start Enalapril and ASA today. Reevaluate in one week. Avoid estrogen containing pills for now.  - Comprehensive metabolic panel - CBC - enalapril (VASOTEC) 5 MG tablet; Take 1 tablet (5 mg total) by mouth daily.  Dispense: 30 tablet; Refill: 1 - aspirin EC 81 MG tablet; Take 1 tablet (81 mg total) by mouth daily. Take after 12 weeks for prevention of preeclampsia later in  pregnancy  Dispense: 300 tablet; Refill: 2  2. Postpartum care following vaginal delivery Recommended one shot of Depo Provera for now, discussed LARCs given need to avoid estrogen for now in the setting of postpartium HTN - medroxyPROGESTERone (DEPO-PROVERA) injection 150 mg  Return in about 1 week (around 11/04/2017) for BP Check (RN visit).  4 weeks: Follow up with MD, contraception, PP HTN.  Jaynie CollinsUGONNA  Ercell Perlman, MD, FACOG Obstetrician & Gynecologist, [facility], [facility]

## 2017-10-29 LAB — COMPREHENSIVE METABOLIC PANEL
ALBUMIN: 4 g/dL (ref 3.5–5.5)
ALK PHOS: 96 IU/L (ref 39–117)
ALT: 9 IU/L (ref 0–32)
AST: 9 IU/L (ref 0–40)
Albumin/Globulin Ratio: 1.5 (ref 1.2–2.2)
BILIRUBIN TOTAL: 0.4 mg/dL (ref 0.0–1.2)
BUN / CREAT RATIO: 15 (ref 9–23)
BUN: 13 mg/dL (ref 6–20)
CHLORIDE: 104 mmol/L (ref 96–106)
CO2: 23 mmol/L (ref 20–29)
Calcium: 9.3 mg/dL (ref 8.7–10.2)
Creatinine, Ser: 0.85 mg/dL (ref 0.57–1.00)
GFR calc Af Amer: 110 mL/min/{1.73_m2} (ref >59)
GFR calc non Af Amer: 96 mL/min/{1.73_m2} (ref >59)
GLOBULIN, TOTAL: 2.7 g/dL (ref 1.5–4.5)
GLUCOSE: 99 mg/dL (ref 65–99)
Potassium: 4.2 mmol/L (ref 3.5–5.2)
SODIUM: 142 mmol/L (ref 134–144)
Total Protein: 6.7 g/dL (ref 6.0–8.5)

## 2017-10-29 LAB — CBC
Hematocrit: 37.3 % (ref 34.0–46.6)
Hemoglobin: 12 g/dL (ref 11.1–15.9)
MCH: 27.4 pg (ref 26.6–33.0)
MCHC: 32.2 g/dL (ref 31.5–35.7)
MCV: 85 fL (ref 79–97)
PLATELETS: 347 10*3/uL (ref 150–450)
RBC: 4.38 x10E6/uL (ref 3.77–5.28)
RDW: 12.9 % (ref 12.3–15.4)
WBC: 9.1 10*3/uL (ref 3.4–10.8)

## 2017-11-04 ENCOUNTER — Ambulatory Visit: Payer: BLUE CROSS/BLUE SHIELD

## 2017-11-04 VITALS — BP 138/88 | HR 71

## 2017-11-04 DIAGNOSIS — Z0130 Encounter for examination of blood pressure without abnormal findings: Secondary | ICD-10-CM

## 2017-11-04 NOTE — Progress Notes (Signed)
..  Subjective:  Misty Thompson is a 27 y.o. female here for BP check.   Hypertension ROS: taking medications as instructed, no medication side effects noted, no TIA's, no chest pain on exertion, no dyspnea on exertion and no swelling of ankles.    Objective:  BP 138/88   Pulse 71   Appearance alert, well appearing, and in no distress. General exam BP noted to be well controlled today in office.    Assessment:   Blood Pressure well controlled.   Plan:  Current treatment plan is effective, no change in therapy.Marland Kitchen

## 2017-11-09 ENCOUNTER — Encounter (HOSPITAL_COMMUNITY): Payer: Self-pay

## 2017-11-09 ENCOUNTER — Ambulatory Visit (HOSPITAL_COMMUNITY)
Admission: EM | Admit: 2017-11-09 | Discharge: 2017-11-09 | Disposition: A | Discharge status: Home / Self Care | Payer: BLUE CROSS/BLUE SHIELD | Attending: Emergency Medicine | Admitting: Emergency Medicine

## 2017-11-09 DIAGNOSIS — L03114 Cellulitis of left upper limb: Secondary | ICD-10-CM

## 2017-11-09 MED ORDER — DOXYCYCLINE HYCLATE 100 MG PO CAPS: 100.0000 mg | ORAL_CAPSULE | Freq: Two times a day (BID) | ORAL | 0 refills | Status: AC

## 2017-11-09 NOTE — Discharge Instructions (Addendum)
Please begin taking doxycycline twice daily for the next 10 days Please continue to monitor area, return if developing increased redness, swelling or pain; this may lead to an abscess that may require drainage

## 2017-11-09 NOTE — ED Triage Notes (Signed)
Pt presents with insect bite on left wrist area.

## 2017-11-11 NOTE — ED Provider Notes (Signed)
[facility]    CSN: [id number] Arrival date & time: 11/09/17  1514     History   Chief Complaint Chief Complaint  Patient presents with  . Insect Bite    HPI Misty Thompson is a 27 y.o. female no significant history presenting today for evaluation of possible insect bite or infection.  She has had an area to her left wrist that has had increasing redness and swelling.  She notes some slight drainage.  Symptoms have been present since Friday, approximately 4 days as unsure if something bit her not.  She is putting anything on this area.  Denies difficulty moving her wrist or fingers.  Not associated with itching.  HPI  Past Medical History:  Diagnosis Date  . Medical history non-contributory   . Right ureteral stone   . Wears contact lenses     There are no active problems to display for this patient.   Past Surgical History:  Procedure Laterality Date  . CYSTOSCOPY WITH RETROGRADE PYELOGRAM, URETEROSCOPY AND STENT PLACEMENT Right 08/05/2013   Procedure: CYSTOSCOPY WITH RETROGRADE PYELOGRAM, URETEROSCOPY AND STENT PLACEMENT;  Surgeon: Danae Chen, MD;  Location: [facility];  Service: Urology;  Laterality: Right;  . WISDOM TOOTH EXTRACTION  MARCH 2015    OB History    Gravida  1   Para      Term      Preterm      AB      Living  0     SAB      TAB      Ectopic      Multiple      Live Births               Home Medications    Prior to Admission medications   Medication Sig Start Date End Date Taking? Authorizing Provider  aspirin EC 81 MG tablet Take 1 tablet (81 mg total) by mouth daily. Take after 12 weeks for prevention of preeclampsia later in pregnancy 10/28/17   Anyanwu, Jethro Bastos, MD  doxycycline (VIBRAMYCIN) 100 MG capsule Take 1 capsule (100 mg total) by mouth 2 (two) times daily for 10 days. 11/09/17 11/19/17  Wieters, Hallie C, PA-C  enalapril (VASOTEC) 5 MG tablet Take 1 tablet (5 mg total) by mouth daily.  10/28/17   Anyanwu, Jethro Bastos, MD  ibuprofen (ADVIL,MOTRIN) 600 MG tablet Take 1 tablet (600 mg total) by mouth every 6 (six) hours. Patient not taking: Reported on 10/28/2017 10/02/17   Marthenia Rolling, DO  Prenatal Vit-Fe Phos-FA-Omega (VITAFOL GUMMIES) 3.33-0.333-34.8 MG CHEW Chew 3 each by mouth daily. 07/28/17   Roe Coombs, CNM    Family History Family History  Problem Relation Age of Onset  . Hypertension Mother   . Diabetes Maternal Grandfather     Social History Social History   Tobacco Use  . Smoking status: Never Smoker  . Smokeless tobacco: Never Used  Substance Use Topics  . Alcohol use: Not Currently  . Drug use: No     Allergies   Patient has no known allergies.   Review of Systems Review of Systems  Constitutional: Negative for fatigue and fever.  HENT: Negative for mouth sores.   Eyes: Negative for visual disturbance.  Respiratory: Negative for shortness of breath.   Cardiovascular: Negative for chest pain.  Gastrointestinal: Negative for abdominal pain, nausea and vomiting.  Musculoskeletal: Negative for arthralgias and joint swelling.  Skin: Positive for color change and rash. Negative  for wound.  Neurological: Negative for dizziness, weakness, light-headedness and headaches.     Physical Exam Triage Vital Signs ED Triage Vitals [11/09/17 1550]  Enc Vitals Group     BP (!) 148/85     Pulse Rate 79     Resp 18     Temp 98.3 F (36.8 C)     Temp Source Temporal     SpO2 100 %     Weight      Height      Head Circumference      Peak Flow      Pain Score      Pain Loc      Pain Edu?      Excl. in GC?    No data found.  Updated Vital Signs BP (!) 148/85 (BP Location: Right Arm)   Pulse 79   Temp 98.3 F (36.8 C) (Temporal)   Resp 18   LMP 11/09/2017   SpO2 100%   Visual Acuity Right Eye Distance:   Left Eye Distance:   Bilateral Distance:    Right Eye Near:   Left Eye Near:    Bilateral Near:     Physical Exam    Constitutional: She is oriented to person, place, and time. She appears well-developed and well-nourished.  No acute distress  HENT:  Head: Normocephalic and atraumatic.  Nose: Nose normal.  Eyes: Conjunctivae are normal.  Neck: Neck supple.  Cardiovascular: Normal rate.  Pulmonary/Chest: Effort normal. No respiratory distress.  Abdominal: She exhibits no distension.  Musculoskeletal: Normal range of motion.  Radial pulse 2+ full active range of motion left wrist and all fingers  Neurological: She is alert and oriented to person, place, and time.  Skin: Skin is warm and dry.  2 cm left wrist with erythema, swelling and central area that appears to be draining, tender to palpation  Psychiatric: She has a normal mood and affect.  Nursing note and vitals reviewed.    UC Treatments / Results  Labs (all labs ordered are listed, but only abnormal results are displayed) Labs Reviewed - No data to display  EKG None  Radiology No results found.  Procedures Procedures (including critical care time)  Medications Ordered in UC Medications - No data to display  Initial Impression / Assessment and Plan / UC Course  I have reviewed the triage vital signs and the nursing notes.  Pertinent labs & imaging results that were available during my care of the patient were reviewed by me and considered in my medical decision making (see chart for details).     Patient appears to have cellulitis versus early abscess of her left wrist.  Will put patient on doxycycline and advised to apply warm compresses.  Continue to monitor the area and follow-up if symptoms worsening or not improving.Discussed strict return precautions. Patient verbalized understanding and is agreeable with plan.  Final Clinical Impressions(s) / UC Diagnoses   Final diagnoses:  Cellulitis of left upper extremity     Discharge Instructions     Please begin taking doxycycline twice daily for the next 10 days Please  continue to monitor area, return if developing increased redness, swelling or pain; this may lead to an abscess that may require drainage    ED Prescriptions    Medication Sig Dispense Auth. Provider   doxycycline (VIBRAMYCIN) 100 MG capsule Take 1 capsule (100 mg total) by mouth 2 (two) times daily for 10 days. 20 capsule Wieters, [place] C, PA-C  Controlled Substance Prescriptions [place] Controlled Substance Registry consulted? Not Applicable   Lew Dawes, [place] 11/11/17 1124

## 2017-11-25 ENCOUNTER — Ambulatory Visit (INDEPENDENT_AMBULATORY_CARE_PROVIDER_SITE_OTHER): Payer: BLUE CROSS/BLUE SHIELD | Admitting: Obstetrics and Gynecology

## 2017-11-25 ENCOUNTER — Encounter: Payer: Self-pay | Admitting: Obstetrics and Gynecology

## 2017-11-25 DIAGNOSIS — Z3042 Encounter for surveillance of injectable contraceptive: Secondary | ICD-10-CM

## 2017-11-25 DIAGNOSIS — Z3009 Encounter for other general counseling and advice on contraception: Secondary | ICD-10-CM

## 2017-11-25 DIAGNOSIS — I10 Essential (primary) hypertension: Secondary | ICD-10-CM

## 2017-11-25 DIAGNOSIS — Z309 Encounter for contraceptive management, unspecified: Secondary | ICD-10-CM | POA: Insufficient documentation

## 2017-11-25 MED ORDER — ENALAPRIL MALEATE 5 MG PO TABS: 5.0000 mg | ORAL_TABLET | Freq: Every day | ORAL | 1 refills | Status: DC

## 2017-11-25 NOTE — Progress Notes (Signed)
Misty Thompson presents to discuss contraception options. Pt with H/O GHTN. FH + HTN Was started on Vasotec at [facility] but stopped Received Depo at [facility] as well. She has no complaints today  PE AF BP 147/92 Lungs clear Heart RRR Abd soft + BS  A/P HTN        Contraception management  Discussed importance of taking BP medications. Pt to obtain PCP for continue eval and follow up of her BP Pt made aware that she has contraception coverage for 12 weeks Pt informed would need BP control for min of 3 months before switching to combination OCP In the mean time recommend continuing with Depo Provera F/U in 3 months

## 2018-01-13 ENCOUNTER — Ambulatory Visit: Payer: BLUE CROSS/BLUE SHIELD

## 2018-01-21 ENCOUNTER — Ambulatory Visit (INDEPENDENT_AMBULATORY_CARE_PROVIDER_SITE_OTHER): Payer: BLUE CROSS/BLUE SHIELD | Admitting: *Deleted

## 2018-01-21 ENCOUNTER — Other Ambulatory Visit: Payer: Self-pay

## 2018-01-21 VITALS — BP 134/81 | HR 71 | Wt 193.0 lb

## 2018-01-21 DIAGNOSIS — Z3042 Encounter for surveillance of injectable contraceptive: Secondary | ICD-10-CM | POA: Diagnosis not present

## 2018-01-21 MED ORDER — MEDROXYPROGESTERONE ACETATE 150 MG/ML IM SUSP: 150.0000 mg | INTRAMUSCULAR | 3 refills | Status: DC

## 2018-01-21 NOTE — Progress Notes (Signed)
I have reviewed this chart and agree with the RN/CMA assessment and management.    K. Meryl Tameshia Bonneville, M.D. Attending [facility] (Faculty Practice)   

## 2018-01-21 NOTE — Progress Notes (Signed)
Pt is in office for Depo injection. Pt is on time for injection. Pt tolerated injection well.  Pt has no other concerns.  Pt advised to RTO 2/27-3/13.   Administrations This Visit    medroxyPROGESTERone (DEPO-PROVERA) injection 150 mg    Admin Date 01/21/2018 Action Given Dose 150 mg Route Intramuscular Administered By Lanney GinsFoster, Hodaya Curto D, CMA

## 2018-02-16 ENCOUNTER — Ambulatory Visit: Payer: BLUE CROSS/BLUE SHIELD | Admitting: Obstetrics and Gynecology

## 2019-06-30 IMAGING — US US OB LIMITED
1 series · 7 of 7 positions shown · non-contrast
Comparison: none

[Series 1: us ob limited · 0.24mm/px · 7 of 7 slices shown]
[im 1/7]
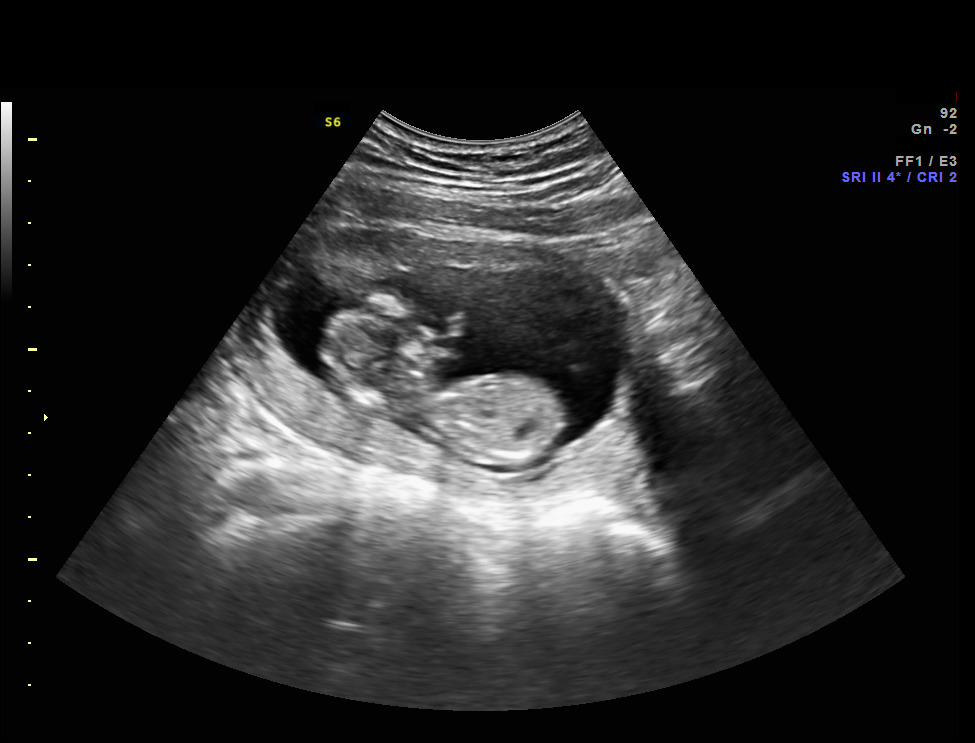
[im 2/7]
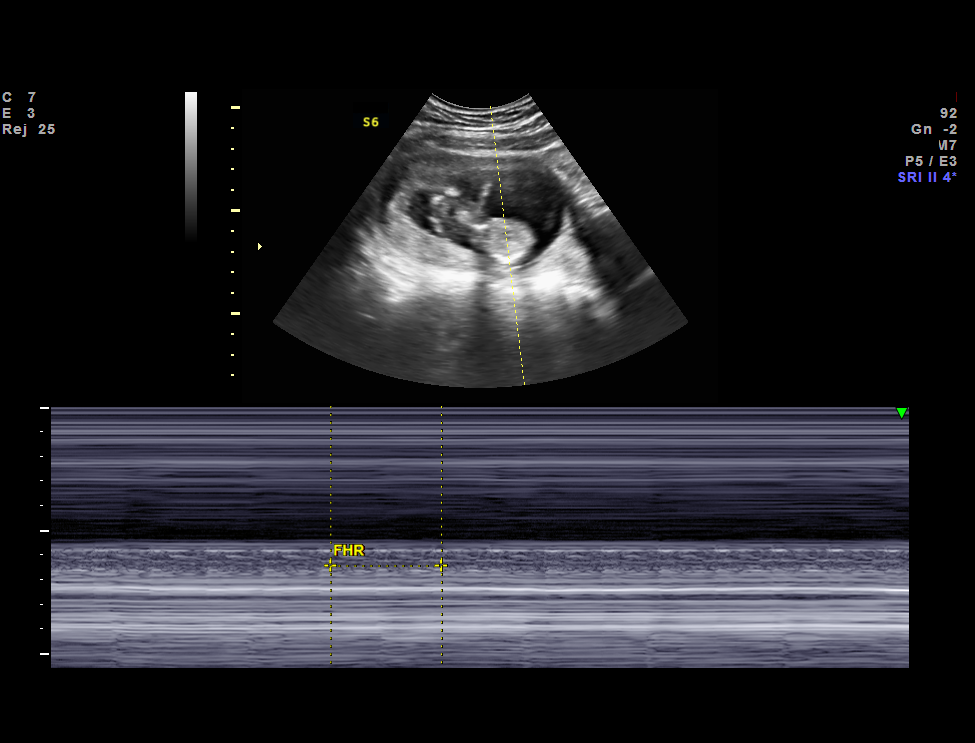
[im 3/7]
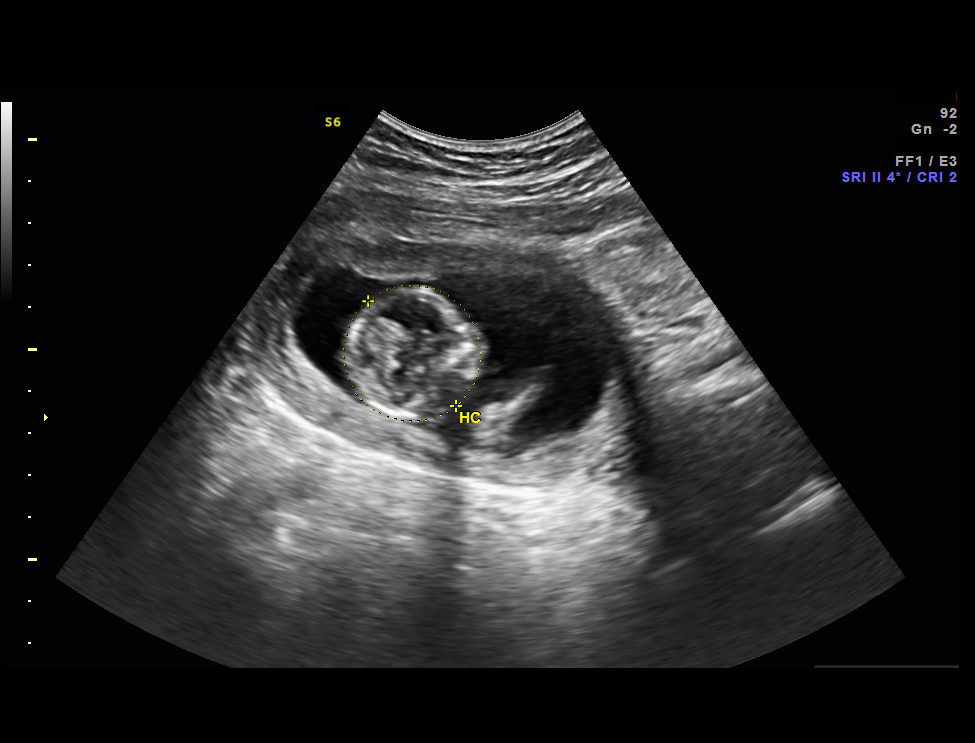
[im 4/7]
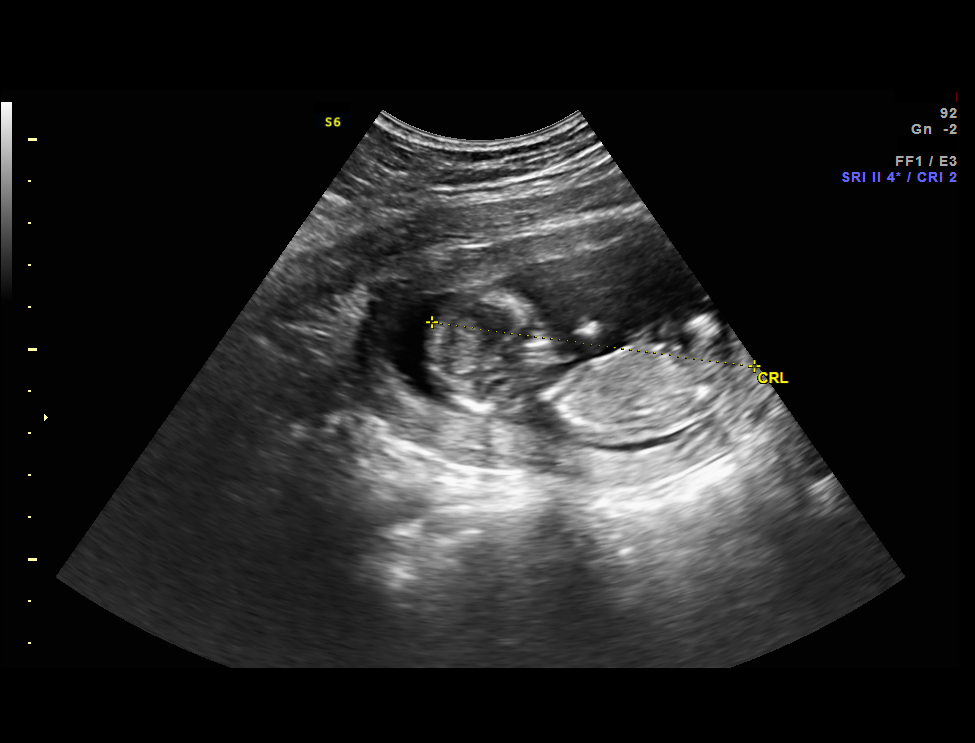
[im 5/7]
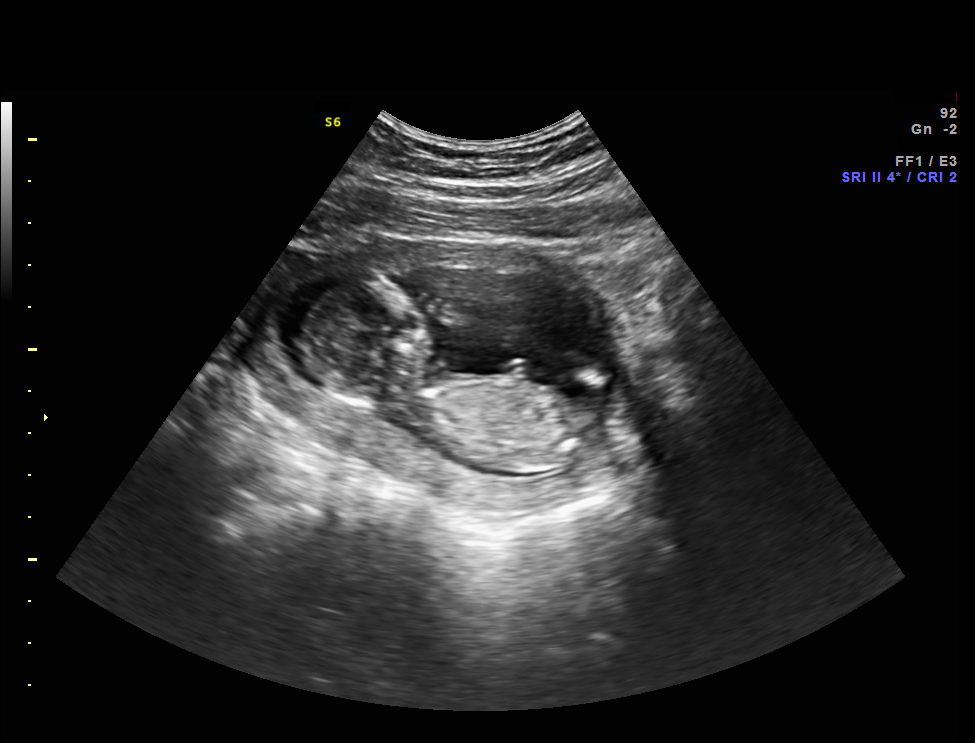
[im 6/7]
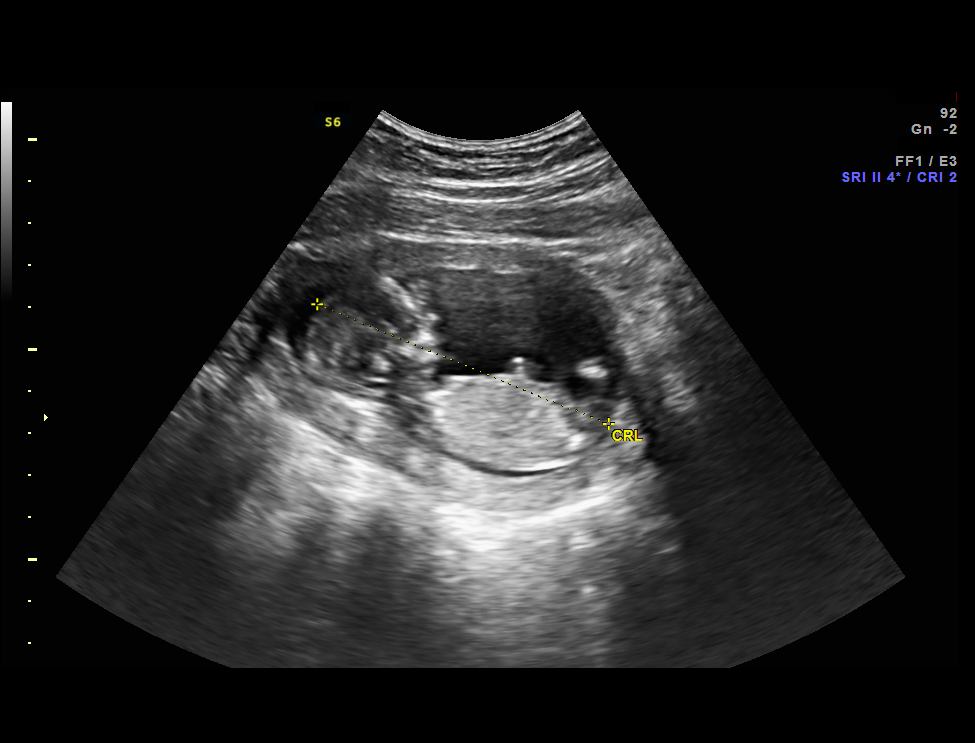
[im 7/7]
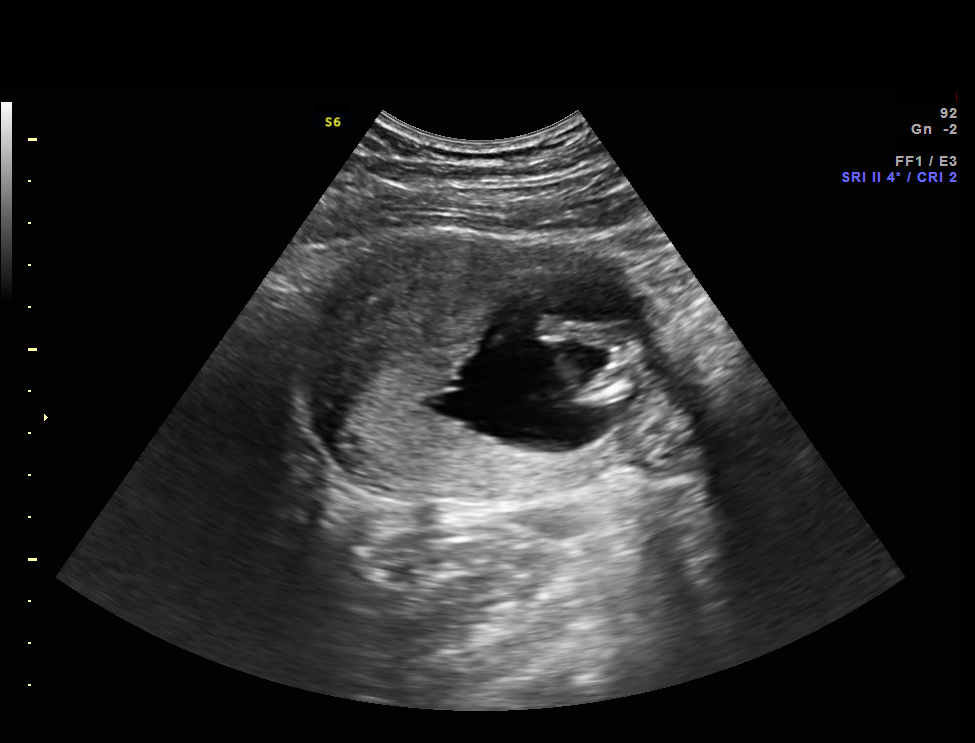

[7 of 7 positions shown; findings below may reference images not displayed]

Attending:        Mxlle Liu         [facility]

1  [HOSPITAL]                               [address].0

1  [facility]           [id number]      [id number]     [id number]
Indications

Weeks of gestation of pregnancy not
specified
Pregnancy with inconclusive fetal viability
Fetal Evaluation

Num Of Fetuses:     1
Fetal Heart         162
Rate(bpm):
Cardiac Activity:   Observed
Biometry

CRL:      76.3  mm     G. Age:  13w 3d                  EDD:   09/25/17

OFD:      32.6  mm
Comments

Viable pregnancy
Impression

Viable IUP
Recommendations

Continue prenatal care

## 2019-08-05 IMAGING — US US MFM OB DETAIL+14 WK
1 series · 14 of 28 positions shown · non-contrast
Comparison: none

[Series 1: us mfm ob detail+14 wk · 14 of 102 slices shown]
[im 4/102]
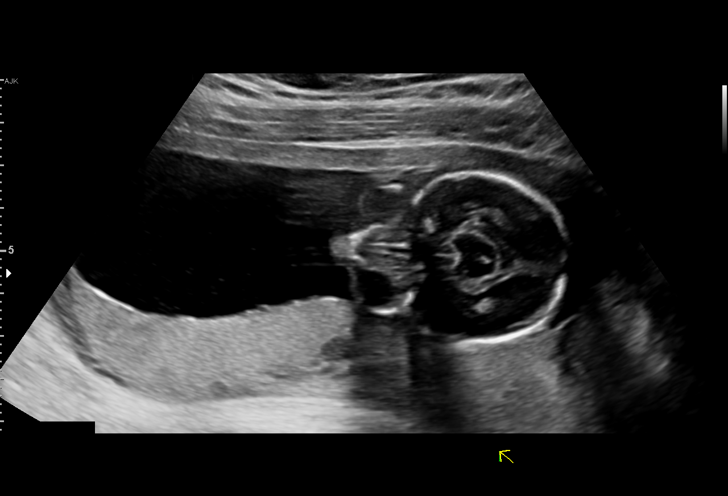
[im 12/102]
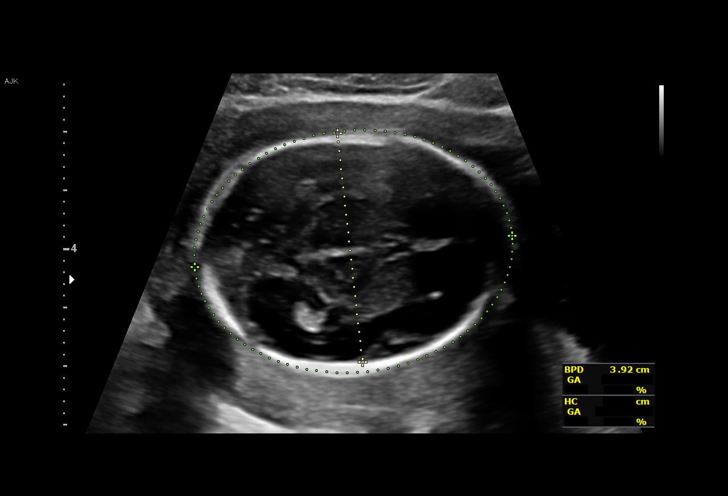
[im 19/102]
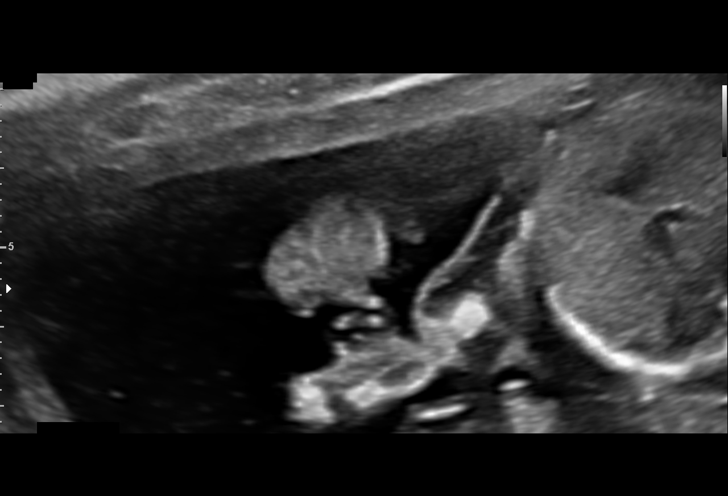
[im 27/102]
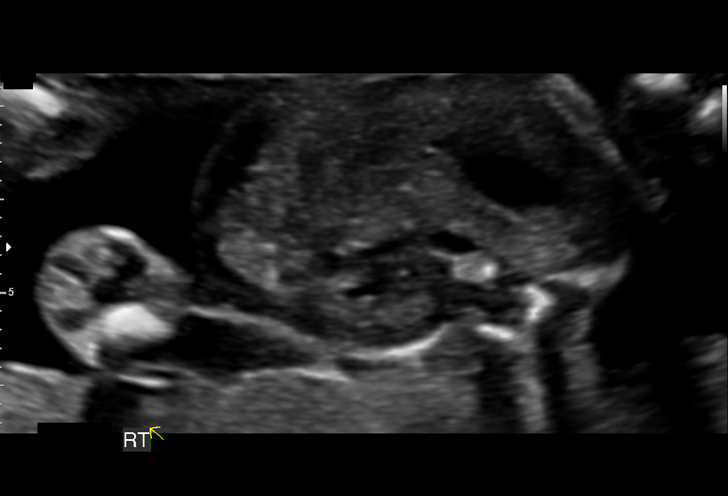
[im 34/102]
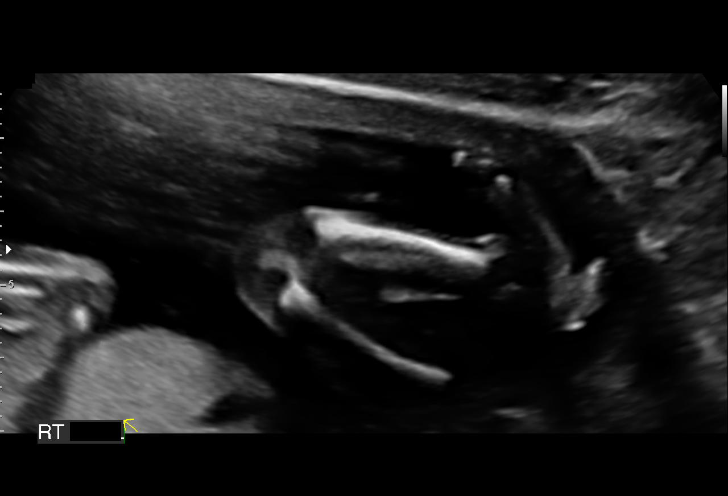
[im 42/102]
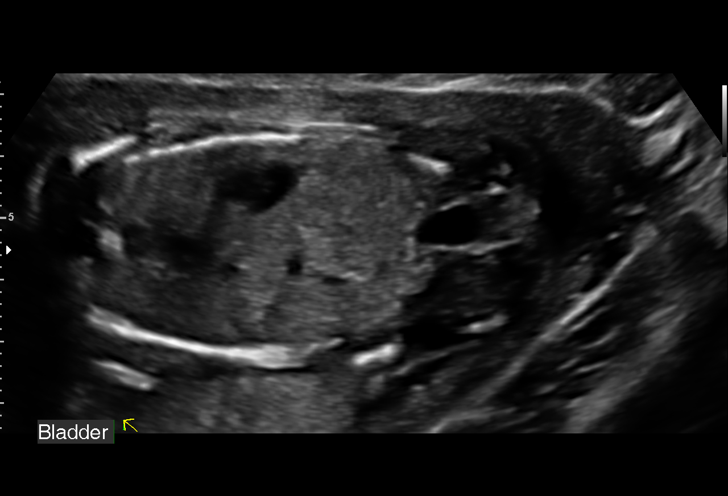
[im 49/102]
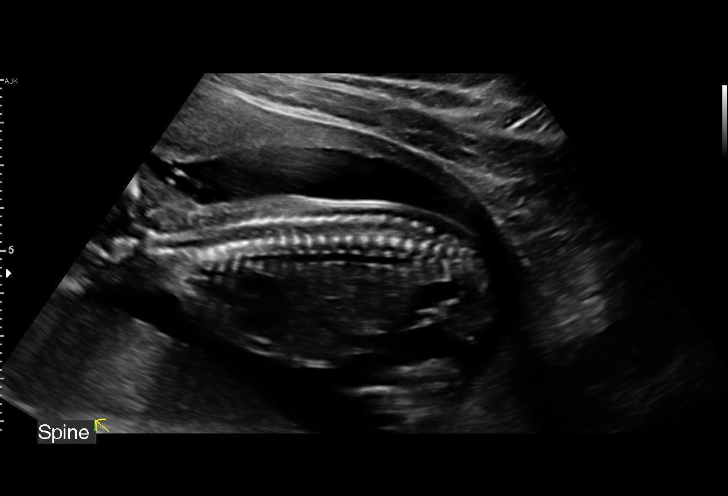
[im 57/102]
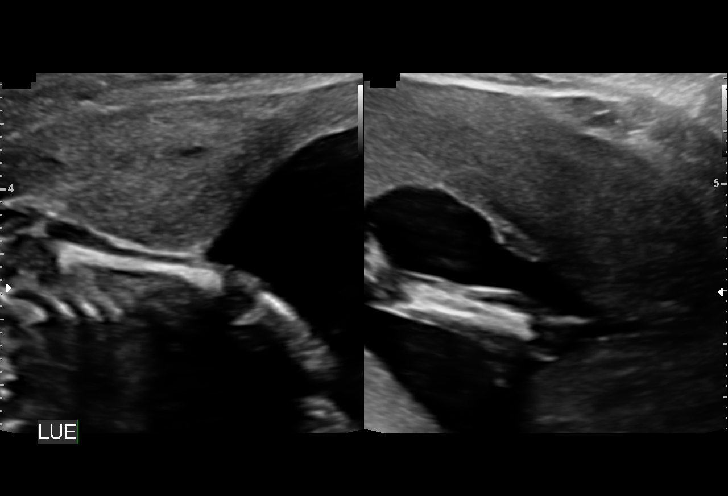
[im 64/102]
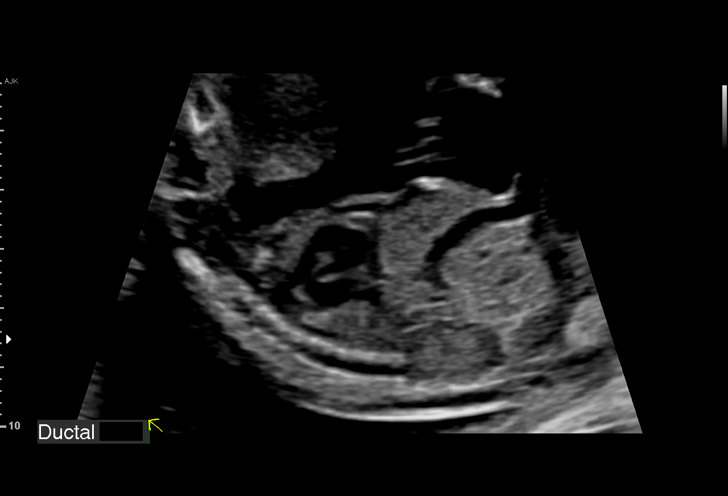
[im 72/102]
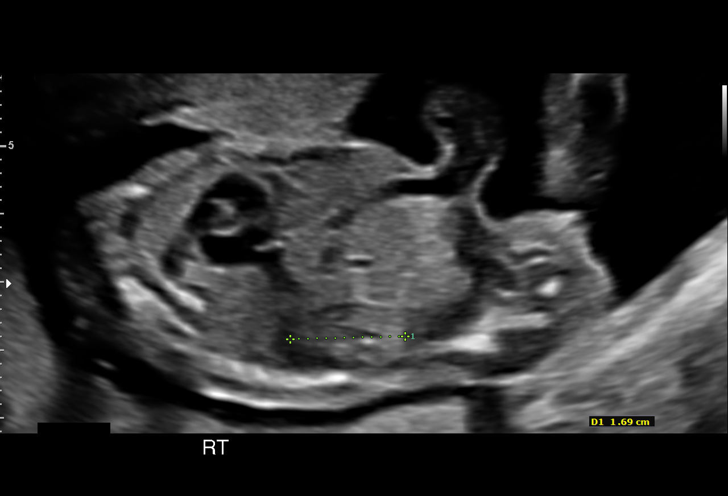
[im 79/102]
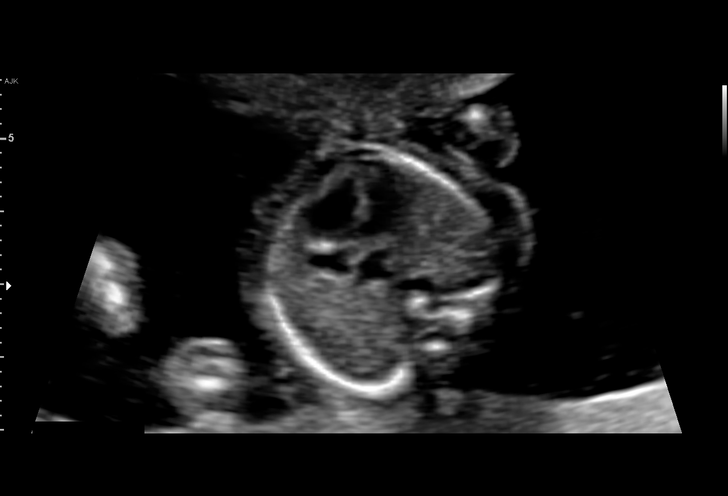
[im 87/102]
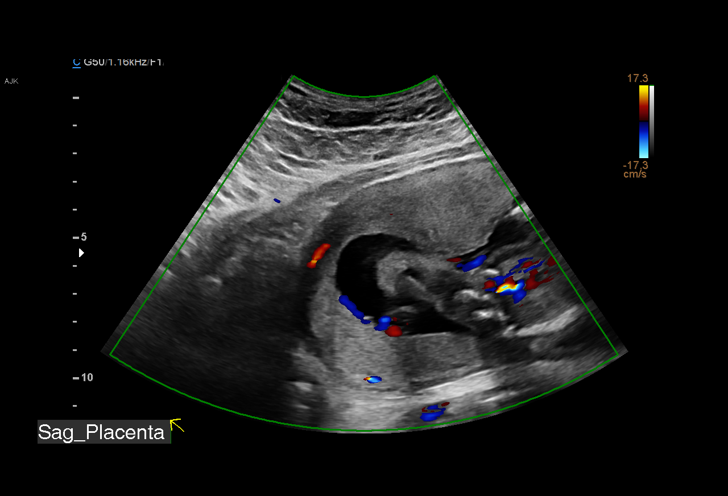
[im 94/102]
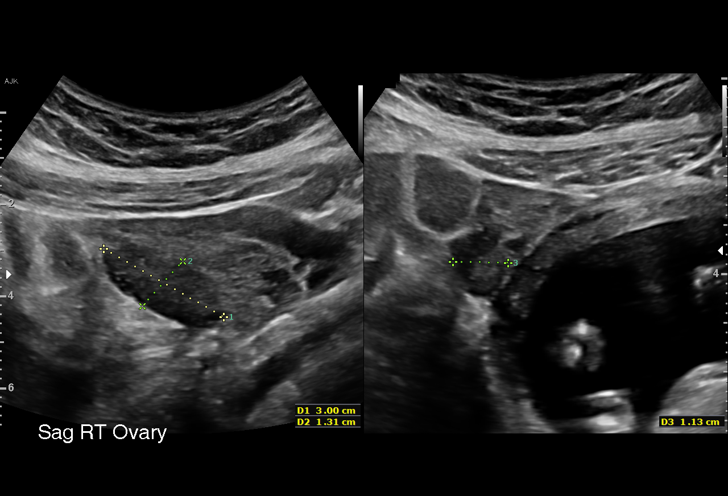
[im 102/102]
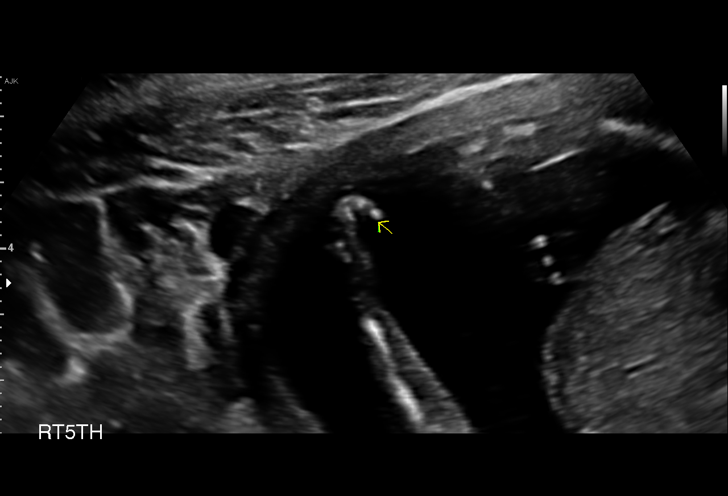

[14 of 28 positions shown; findings below may reference images not displayed]

1  RENTMY DELAC           [id number]      [id number]     [id number]
Indications

18 weeks gestation of pregnancy
Encounter for fetal anatomic survey
Obesity complicating pregnancy, second
trimester
OB History

Blood Type:            Height:  5'2"   Weight (lb):  194       BMI:
Gravidity:    1
Fetal Evaluation

Num Of Fetuses:     1
Fetal Heart         150
Rate(bpm):
Cardiac Activity:   Observed
Presentation:       Variable
Placenta:           Posterior, above cervical os
P. Cord Insertion:  Visualized, central

Amniotic Fluid
AFI FV:      Subjectively within normal limits

Largest Pocket(cm)
6.22
Biometry

BPD:      39.9  mm     G. Age:  18w 1d         20  %    CI:        71.71   %    70 - 86
FL/HC:      16.9   %    16.1 -
HC:       150   mm     G. Age:  18w 1d         11  %    HC/AC:      1.16        1.09 -
AC:      129.4  mm     G. Age:  18w 4d         34  %    FL/BPD:     63.4   %
FL:       25.3  mm     G. Age:  17w 5d          9  %    FL/AC:      19.6   %    20 - 24
CER:      19.9  mm     G. Age:  19w 0d         54  %
NFT:       2.2  mm

CM:        5.8  mm

Est. FW:     224  gm      0 lb 8 oz     30  %
Gestational Age

LMP:           18w 6d        Date:  12/17/16                 EDD:   09/23/17
U/S Today:     18w 1d                                        EDD:   09/28/17
Best:          18w 6d     Det. By:  LMP  (12/17/16)          EDD:   09/23/17
Anatomy

Cranium:               Appears normal         Aortic Arch:            Appears normal
Cavum:                 Appears normal         Ductal Arch:            Appears normal
Ventricles:            Appears normal         Diaphragm:              Appears normal
Choroid Plexus:        Appears normal         Stomach:                Appears normal, left
sided
Cerebellum:            Appears normal         Abdomen:                Appears normal
Posterior Fossa:       Appears normal         Abdominal Wall:         Appears nml (cord
insert, abd wall)
Nuchal Fold:           Appears normal         Cord Vessels:           Appears normal (3
vessel cord)
Face:                  Appears normal         Kidneys:                Appear normal
(orbits and profile)
Lips:                  Appears normal         Bladder:                Appears normal
Thoracic:              Appears normal         Spine:                  Appears normal
Heart:                 Appears normal         Upper Extremities:      Appears normal
(4CH, axis, and
situs)
RVOT:                  Appears normal         Lower Extremities:      Appears normal
LVOT:                  Appears normal

Other:  Fetus appears to be a female. Nasal bone visualized. Heels and 5th
digit visualized.
Cervix Uterus Adnexa

Cervix
Length:           5.33  cm.
Normal appearance by transabdominal scan.

Uterus
No abnormality visualized.

Left Ovary
Within normal limits.

Right Ovary
Within normal limits.

Adnexa:       No abnormality visualized.
Impression

Single living intrauterine pregnancy at 18w 6d.
Variable presentation.
Placenta Posterior, above cervical os.
Appropriate fetal growth.
Normal amniotic fluid volume.
The fetal anatomic survey is complete.
Normal appearing fetal anatomy.
No fetal anomalies or soft markers of aneuploidy seen.
The adnexa appear normal bilaterally without masses.
The cervix measures 5.33cm on transabdominal imaging
without funneling.
Recommendations

Follow-up ultrasounds as clinically indicated.

## 2019-09-21 ENCOUNTER — Ambulatory Visit (HOSPITAL_COMMUNITY)
Admission: EM | Admit: 2019-09-21 | Discharge: 2019-09-21 | Disposition: A | Discharge status: Home / Self Care | Payer: Medicaid Other | Attending: Emergency Medicine | Admitting: Emergency Medicine

## 2019-09-21 ENCOUNTER — Encounter (HOSPITAL_COMMUNITY): Payer: Self-pay | Admitting: Emergency Medicine

## 2019-09-21 ENCOUNTER — Other Ambulatory Visit: Payer: Self-pay

## 2019-09-21 DIAGNOSIS — R1032 Left lower quadrant pain: Secondary | ICD-10-CM

## 2019-09-21 DIAGNOSIS — Z3202 Encounter for pregnancy test, result negative: Secondary | ICD-10-CM | POA: Diagnosis not present

## 2019-09-21 LAB — POCT URINALYSIS DIPSTICK, ED / UC
Bilirubin Urine: NEGATIVE
Glucose, UA: NEGATIVE mg/dL
Leukocytes,Ua: NEGATIVE
Nitrite: NEGATIVE
Protein, ur: NEGATIVE mg/dL
Specific Gravity, Urine: >=1.03 (ref 1.005–1.030)
Urobilinogen, UA: 1 mg/dL (ref 0.0–1.0)
pH: 6.5 (ref 5.0–8.0)

## 2019-09-21 LAB — POC URINE PREG, ED: Preg Test, Ur: NEGATIVE

## 2019-09-21 MED ORDER — TAMSULOSIN HCL 0.4 MG PO CAPS: 0.4000 mg | ORAL_CAPSULE | Freq: Every day | ORAL | 0 refills | Status: DC

## 2019-09-21 MED ORDER — NAPROXEN 500 MG PO TBEC: 500.0000 mg | DELAYED_RELEASE_TABLET | Freq: Two times a day (BID) | ORAL | 0 refills | Status: DC

## 2019-09-21 NOTE — Discharge Instructions (Signed)
Small amount of blood in urine, may be suggestive of possible stone Begin Flomax daily Continue ibuprofen/Tylenol for pain or may try Naprosyn twice daily as needed for pain Drink plenty of fluids Please monitor pain over the next 24 to 48 hours Follow-up if not improving or worsening  Follow-up with occupational health for hepatitis B titers

## 2019-09-21 NOTE — ED Provider Notes (Signed)
[facility]    CSN: [id number] Arrival date & time: 09/21/19  1630      History   Chief Complaint Chief Complaint  Patient presents with  . Abdominal Pain    HPI Misty Thompson is a 29 y.o. female history of prior kidney stones presenting today for evaluation of abdominal pain.  Patient reports that beginning yesterday she has had pain in her left lower quadrant.  Has been using ibuprofen which has been easing the pain temporarily.  Denies any associated nausea or vomiting.  Denies associated urinary symptoms of dysuria, increased frequency, urgency, hematuria.  Has had prior kidney stone, but reports previously with lower back pain.  Denies any pelvic symptoms of vaginal discharge, itching or irritation.  Denies any abnormal bleeding.  Last menstrual cycle around 08/30/2019.  Is not on birth control.  Is sexually active with a stable partner.  Denies concerns for STDs.  She has been eating and drinking normally without affecting pain.  Reports bowel movements typically every other day, last bowel movement yesterday.  Denies any increased straining or constipation of recently.  HPI  Past Medical History:  Diagnosis Date  . Medical history non-contributory   . Right ureteral stone   . Wears contact lenses     Patient Active Problem List   Diagnosis Date Noted  . Contraception management 11/25/2017  . Hypertension 11/25/2017    Past Surgical History:  Procedure Laterality Date  . CYSTOSCOPY WITH RETROGRADE PYELOGRAM, URETEROSCOPY AND STENT PLACEMENT Right 08/05/2013   Procedure: CYSTOSCOPY WITH RETROGRADE PYELOGRAM, URETEROSCOPY AND STENT PLACEMENT;  Surgeon: Danae Chen, MD;  Location: [facility];  Service: Urology;  Laterality: Right;  . WISDOM TOOTH EXTRACTION  MARCH 2015    OB History    Gravida  1   Para      Term      Preterm      AB      Living  0     SAB      TAB      Ectopic      Multiple      Live Births                 Home Medications    Prior to Admission medications   Medication Sig Start Date End Date Taking? Authorizing Provider  naproxen (EC NAPROSYN) 500 MG EC tablet Take 1 tablet (500 mg total) by mouth 2 (two) times daily with a meal. 09/21/19   Deyanira Fesler C, PA-C  tamsulosin (FLOMAX) 0.4 MG CAPS capsule Take 1 capsule (0.4 mg total) by mouth daily. 09/21/19   Taisei Bonnette C, PA-C  enalapril (VASOTEC) 5 MG tablet Take 1 tablet (5 mg total) by mouth daily. 11/25/17 09/21/19  Hermina Staggers, MD  medroxyPROGESTERone (DEPO-PROVERA) 150 MG/ML injection Inject 1 mL (150 mg total) into the muscle every 3 (three) months. 01/21/18 09/21/19  Tereso Newcomer, MD    Family History Family History  Problem Relation Age of Onset  . Hypertension Mother   . Diabetes Maternal Grandfather     Social History Social History   Tobacco Use  . Smoking status: Never Smoker  . Smokeless tobacco: Never Used  Vaping Use  . Vaping Use: Never used  Substance Use Topics  . Alcohol use: Not Currently  . Drug use: No     Allergies   Patient has no known allergies.   Review of Systems Review of Systems  Constitutional: Negative for fever.  Respiratory: Negative for shortness of breath.   Cardiovascular: Negative for chest pain.  Gastrointestinal: Positive for abdominal pain. Negative for diarrhea, nausea and vomiting.  Genitourinary: Negative for dysuria, flank pain, genital sores, hematuria, menstrual problem, vaginal bleeding, vaginal discharge and vaginal pain.  Musculoskeletal: Negative for back pain.  Skin: Negative for rash.  Neurological: Negative for dizziness, light-headedness and headaches.     Physical Exam Triage Vital Signs ED Triage Vitals  Enc Vitals Group     BP 09/21/19 1759 (!) 143/82     Pulse Rate 09/21/19 1759 77     Resp 09/21/19 1759 18     Temp 09/21/19 1759 98.6 F (37 C)     Temp Source 09/21/19 1759 Oral     SpO2 09/21/19 1759 100 %     Weight --       Height --      Head Circumference --      Peak Flow --      Pain Score 09/21/19 1800 0     Pain Loc --      Pain Edu? --      Excl. in GC? --    No data found.  Updated Vital Signs BP (!) 143/82 (BP Location: Right Arm)   Pulse 77   Temp 98.6 F (37 C) (Oral)   Resp 18   SpO2 100%   Visual Acuity Right Eye Distance:   Left Eye Distance:   Bilateral Distance:    Right Eye Near:   Left Eye Near:    Bilateral Near:     Physical Exam Vitals and nursing note reviewed.  Constitutional:      Appearance: She is well-developed.     Comments: No acute distress  HENT:     Head: Normocephalic and atraumatic.     Nose: Nose normal.  Eyes:     Conjunctiva/sclera: Conjunctivae normal.  Cardiovascular:     Rate and Rhythm: Normal rate.  Pulmonary:     Effort: Pulmonary effort is normal. No respiratory distress.     Comments: Breathing comfortably at rest, CTABL, no wheezing, rales or other adventitious sounds auscultated Abdominal:     General: There is no distension.     Comments: Soft, nondistended, nontender to palpation to bilateral lower quadrants  Musculoskeletal:        General: Normal range of motion.     Cervical back: Neck supple.  Skin:    General: Skin is warm and dry.  Neurological:     Mental Status: She is alert and oriented to person, place, and time.      UC Treatments / Results  Labs (all labs ordered are listed, but only abnormal results are displayed) Labs Reviewed  POCT URINALYSIS DIPSTICK, ED / UC - Abnormal; Notable for the following components:      Result Value   Ketones, ur TRACE (*)    Hgb urine dipstick TRACE (*)    All other components within normal limits  POC URINE PREG, ED    EKG   Radiology No results found.  Procedures Procedures (including critical care time)  Medications Ordered in UC Medications - No data to display  Initial Impression / Assessment and Plan / UC Course  I have reviewed the triage vital signs and the  nursing notes.  Pertinent labs & imaging results that were available during my care of the patient were reviewed by me and considered in my medical decision making (see chart for details).     UA  with negative leuks and nitrites, trace hemoglobin and trace ketones.  Cannot rule out stone given hemoglobin.  Less likely UTI.  Patient denies any pelvic symptoms and denies any concerns for STDs, wishing to defer testing at this time.  Will treat symptomatically and supportively and recommend fluids, Naprosyn, Flomax for possible stone and close monitoring of symptoms over the next 24 to 48 hours.  Negative peritoneal signs, do not suspect abdominal emergency at this time.  Seems less likely GI related given eating and drinking without any change in symptoms.  Discussed strict return precautions. Patient verbalized understanding and is agreeable with plan.  Final Clinical Impressions(s) / UC Diagnoses   Final diagnoses:  Left lower quadrant abdominal pain     Discharge Instructions     Small amount of blood in urine, may be suggestive of possible stone Begin Flomax daily Continue ibuprofen/Tylenol for pain or may try Naprosyn twice daily as needed for pain Drink plenty of fluids Please monitor pain over the next 24 to 48 hours Follow-up if not improving or worsening  Follow-up with occupational health for hepatitis B titers    ED Prescriptions    Medication Sig Dispense Auth. Provider   naproxen (EC NAPROSYN) 500 MG EC tablet Take 1 tablet (500 mg total) by mouth 2 (two) times daily with a meal. 30 tablet Lisamarie Coke C, PA-C   tamsulosin (FLOMAX) 0.4 MG CAPS capsule Take 1 capsule (0.4 mg total) by mouth daily. 10 capsule Liz Pinho, [place] C, PA-C     PDMP not reviewed this encounter.   Lew Dawes, [place] 09/21/19 1853

## 2019-09-21 NOTE — ED Triage Notes (Signed)
Pt presents to [facility] for assessment of LLQ pain starting yesterday.  Took an 800mg  Ibuprofen at 1400 today and denies pain at this time.  Patient denies n/v/d.

## 2022-11-27 ENCOUNTER — Other Ambulatory Visit (HOSPITAL_BASED_OUTPATIENT_CLINIC_OR_DEPARTMENT_OTHER): Payer: Self-pay

## 2022-11-27 ENCOUNTER — Other Ambulatory Visit: Payer: Self-pay

## 2022-11-27 MED ORDER — SEMAGLUTIDE-WEIGHT MANAGEMENT 0.25 MG/0.5ML ~~LOC~~ SOAJ
0.2500 mg | SUBCUTANEOUS | 0 refills | Status: DC
  Filled 2022-11-27 (×2): qty 2, 28d supply, fill #0

## 2022-12-24 ENCOUNTER — Other Ambulatory Visit (HOSPITAL_BASED_OUTPATIENT_CLINIC_OR_DEPARTMENT_OTHER): Payer: Self-pay

## 2022-12-25 ENCOUNTER — Other Ambulatory Visit (HOSPITAL_BASED_OUTPATIENT_CLINIC_OR_DEPARTMENT_OTHER): Payer: Self-pay

## 2022-12-26 ENCOUNTER — Other Ambulatory Visit (HOSPITAL_BASED_OUTPATIENT_CLINIC_OR_DEPARTMENT_OTHER): Payer: Self-pay

## 2022-12-31 ENCOUNTER — Other Ambulatory Visit (HOSPITAL_BASED_OUTPATIENT_CLINIC_OR_DEPARTMENT_OTHER): Payer: Self-pay

## 2023-01-05 ENCOUNTER — Other Ambulatory Visit (HOSPITAL_BASED_OUTPATIENT_CLINIC_OR_DEPARTMENT_OTHER): Payer: Self-pay

## 2023-01-06 ENCOUNTER — Other Ambulatory Visit: Payer: Self-pay

## 2023-01-06 ENCOUNTER — Other Ambulatory Visit (HOSPITAL_BASED_OUTPATIENT_CLINIC_OR_DEPARTMENT_OTHER): Payer: Self-pay

## 2023-01-06 MED ORDER — SEMAGLUTIDE-WEIGHT MANAGEMENT 0.5 MG/0.5ML ~~LOC~~ SOAJ
0.5000 mg | SUBCUTANEOUS | 0 refills | Status: DC
  Filled 2023-01-06: qty 2, 28d supply, fill #0

## 2023-01-09 ENCOUNTER — Other Ambulatory Visit (HOSPITAL_BASED_OUTPATIENT_CLINIC_OR_DEPARTMENT_OTHER): Payer: Self-pay

## 2023-01-09 MED ORDER — WEGOVY 0.25 MG/0.5ML ~~LOC~~ SOAJ
0.2500 mg | SUBCUTANEOUS | 0 refills | Status: DC
  Filled 2023-01-09: qty 2, 28d supply, fill #0

## 2023-01-18 ENCOUNTER — Emergency Department (HOSPITAL_BASED_OUTPATIENT_CLINIC_OR_DEPARTMENT_OTHER)
Admission: EM | Admit: 2023-01-18 | Discharge: 2023-01-18 | Disposition: A | Discharge status: Home / Self Care | Payer: Federal, State, Local not specified - PPO | Attending: Emergency Medicine | Admitting: Emergency Medicine

## 2023-01-18 ENCOUNTER — Encounter (HOSPITAL_BASED_OUTPATIENT_CLINIC_OR_DEPARTMENT_OTHER): Payer: Self-pay

## 2023-01-18 ENCOUNTER — Other Ambulatory Visit: Payer: Self-pay

## 2023-01-18 DIAGNOSIS — O139 Gestational [pregnancy-induced] hypertension without significant proteinuria, unspecified trimester: Secondary | ICD-10-CM | POA: Insufficient documentation

## 2023-01-18 DIAGNOSIS — Z79899 Other long term (current) drug therapy: Secondary | ICD-10-CM | POA: Insufficient documentation

## 2023-01-18 DIAGNOSIS — Z3A00 Weeks of gestation of pregnancy not specified: Secondary | ICD-10-CM | POA: Diagnosis not present

## 2023-01-18 DIAGNOSIS — O99891 Other specified diseases and conditions complicating pregnancy: Secondary | ICD-10-CM | POA: Diagnosis present

## 2023-01-18 DIAGNOSIS — O26899 Other specified pregnancy related conditions, unspecified trimester: Secondary | ICD-10-CM | POA: Diagnosis not present

## 2023-01-18 DIAGNOSIS — Z3201 Encounter for pregnancy test, result positive: Secondary | ICD-10-CM

## 2023-01-18 LAB — URINALYSIS, ROUTINE W REFLEX MICROSCOPIC
Bacteria, UA: NONE SEEN
Bilirubin Urine: NEGATIVE
Glucose, UA: NEGATIVE mg/dL
Ketones, ur: NEGATIVE mg/dL
Leukocytes,Ua: NEGATIVE
Nitrite: NEGATIVE
Specific Gravity, Urine: 1.032 — ABNORMAL HIGH (ref 1.005–1.030)
pH: 5.5 (ref 5.0–8.0)

## 2023-01-18 LAB — PREGNANCY, URINE: Preg Test, Ur: POSITIVE — AB

## 2023-01-18 NOTE — ED Triage Notes (Signed)
Pt POV from home reporting L flank pain that radiates down to L leg, denies dysuria or frequency, hx kidney stones.

## 2023-01-18 NOTE — Discharge Instructions (Addendum)
It was a pleasure caring for you today in the emergency department.  Please call your OB/GYN tomorrow to arrange follow-up.  Please start taking prenatal vitamins not already doing so.  Please return to the ER if you develop pelvic pain, vaginal bleeding, difficulty urinating, or any other worsening or worrisome symptoms.

## 2023-01-18 NOTE — ED Provider Notes (Signed)
[place] EMERGENCY DEPARTMENT AT [facility] Provider Note  CSN: [id number] Arrival date & time: 01/18/23 1707  Chief Complaint(s) Flank Pain  HPI Misty Thompson is a 32 y.o. female with past medical history as below, significant for hypertension, nephrolithiasis who presents to the ED with complaint of left low back pain, left thigh pain  She is here with her boyfriend, reports left-sided lumbar pain, left thigh pain.  Ongoing around 2 days.  Similar symptoms when she was previously pregnant and when she had a kidney stone.  No recent falls or injuries, no abdominal pain, no more vaginal bleeding or discharge.  LMP was around 1 month ago.  She is currently sexually active.  Past Medical History Past Medical History:  Diagnosis Date   Medical history non-contributory    Right ureteral stone    Wears contact lenses    Patient Active Problem List   Diagnosis Date Noted   Contraception management 11/25/2017   Hypertension 11/25/2017   Home Medication(s) Prior to Admission medications   Medication Sig Start Date End Date Taking? Authorizing Provider  Semaglutide-Weight Management (WEGOVY) 0.25 MG/0.5ML SOAJ Inject 0.25 mg into the skin every 7 (seven) days. 01/09/23     Semaglutide-Weight Management (WEGOVY) 0.25 MG/0.5ML SOAJ Inject 0.25 mg into the skin every 7 (seven) days. 01/09/23     Semaglutide-Weight Management 0.5 MG/0.5ML SOAJ Inject 0.5 mg into the skin once a week. 12/19/22     enalapril (VASOTEC) 5 MG tablet Take 1 tablet (5 mg total) by mouth daily. 11/25/17 09/21/19  Hermina Staggers, MD  medroxyPROGESTERone (DEPO-PROVERA) 150 MG/ML injection Inject 1 mL (150 mg total) into the muscle every 3 (three) months. 01/21/18 09/21/19  Tereso Newcomer, MD                                                                                                                                    Past Surgical History Past Surgical History:  Procedure Laterality Date    CYSTOSCOPY WITH RETROGRADE PYELOGRAM, URETEROSCOPY AND STENT PLACEMENT Right 08/05/2013   Procedure: CYSTOSCOPY WITH RETROGRADE PYELOGRAM, URETEROSCOPY AND STENT PLACEMENT;  Surgeon: Danae Chen, MD;  Location: [facility];  Service: Urology;  Laterality: Right;   WISDOM TOOTH EXTRACTION  MARCH 2015   Family History Family History  Problem Relation Age of Onset   Hypertension Mother    Diabetes Maternal Grandfather     Social History Social History   Tobacco Use   Smoking status: Never   Smokeless tobacco: Never  Vaping Use   Vaping status: Never Used  Substance Use Topics   Alcohol use: Not Currently   Drug use: No   Allergies Patient has no known allergies.  Review of Systems Review of Systems  Constitutional:  Negative for chills.  HENT:  Negative for congestion.   Cardiovascular:  Negative for chest pain.  Gastrointestinal:  Negative for abdominal pain.  Genitourinary:  Negative for dysuria, pelvic pain and vaginal bleeding.  Musculoskeletal:  Positive for arthralgias.  All other systems reviewed and are negative.   Physical Exam Vital Signs  I have reviewed the triage vital signs BP (!) 138/94   Pulse 88   Temp 98.1 F (36.7 C)   Resp 18   Ht 5\' 3"  (1.6 m)   Wt 95.3 kg   LMP 12/19/2022 (Approximate)   SpO2 99%   BMI 37.20 kg/m  Physical Exam Vitals and nursing note reviewed.  Constitutional:      General: She is not in acute distress.    Appearance: Normal appearance. She is well-developed. She is not ill-appearing.  HENT:     Head: Normocephalic and atraumatic.     Right Ear: External ear normal.     Left Ear: External ear normal.     Nose: Nose normal.     Mouth/Throat:     Mouth: Mucous membranes are moist.  Eyes:     General: No scleral icterus.       Right eye: No discharge.        Left eye: No discharge.  Cardiovascular:     Rate and Rhythm: Normal rate.  Pulmonary:     Effort: Pulmonary effort is normal. No respiratory  distress.     Breath sounds: No stridor.  Abdominal:     General: Abdomen is flat. There is no distension.     Tenderness: There is no abdominal tenderness. There is no guarding or rebound. Negative signs include Murphy's sign and Rovsing's sign.  Musculoskeletal:        General: No deformity.       Arms:     Cervical back: No rigidity.     Comments: No midline pain Mild muscle spasm to left lower lumbar  Skin:    General: Skin is warm and dry.     Coloration: Skin is not cyanotic, jaundiced or pale.  Neurological:     Mental Status: She is alert and oriented to person, place, and time.     GCS: GCS eye subscore is 4. GCS verbal subscore is 5. GCS motor subscore is 6.  Psychiatric:        Speech: Speech normal.        Behavior: Behavior normal. Behavior is cooperative.     ED Results and Treatments Labs (all labs ordered are listed, but only abnormal results are displayed) Labs Reviewed  URINALYSIS, ROUTINE W REFLEX MICROSCOPIC - Abnormal; Notable for the following components:      Result Value   Specific Gravity, Urine 1.032 (*)    Hgb urine dipstick MODERATE (*)    Protein, ur TRACE (*)    All other components within normal limits  PREGNANCY, URINE - Abnormal; Notable for the following components:   Preg Test, Ur POSITIVE (*)    All other components within normal limits  Radiology No results found.  Pertinent labs & imaging results that were available during my care of the patient were reviewed by me and considered in my medical decision making (see MDM for details).  Medications Ordered in ED Medications - No data to display                                                                                                                                   Procedures Procedures  (including critical care time)  Medical Decision Making / ED  Course    Medical Decision Making:    Misty Thompson is a 32 y.o. female with past medical history as below, significant for hypertension, nephrolithiasis who presents to the ED with complaint of left low back pain, left thigh pain. The complaint involves an extensive differential diagnosis and also carries with it a high risk of complications and morbidity.  Serious etiology was considered. Ddx includes but is not limited to: nephrolithiasis, preg, msk,  Differential diagnosis includes but is not exclusive to musculoskeletal back pain, renal colic, urinary tract infection, pyelonephritis, intra-abdominal causes of back pain, aortic aneurysm or dissection, cauda equina syndrome, sciatica, lumbar disc disease, thoracic disc disease, etc.   Complete initial physical exam performed, notably the patient was in NAD, sitting upright, ambulatory.    Reviewed and confirmed nursing documentation for past medical history, family history, social history.  Vital signs reviewed.     Clinical Course as of 01/18/23 2028  Wynelle Link Jan 18, 2023  2025 Bacteria, UA: NONE SEEN [SG]  2025 Preg Test, Ur(!): POSITIVE [SG]    Clinical Course User Index [SG] Sloan Leiter, DO    Brief summary: 32 year old female here with left-sided low back pain, left upper thigh pain.  Her exam is stable.  No abdominal pain or pelvic pain.  She is NVI globally, ambulatory.  No midline low back pain.  Urinalysis and pregnancy test that resulted, urine Preg was positive.  She is not having any vaginal bleeding or pelvic pain, she has not any further imaging or workup at this time.  Reports that she will call her OB/GYN on Monday to arrange follow-up.  The patient improved significantly and was discharged in stable condition. Detailed discussions were had with the patient regarding current findings, and need for close f/u with PCP or on call doctor. The patient has been instructed to return immediately if the symptoms worsen in any  way for re-evaluation. Patient verbalized understanding and is in agreement with current care plan. All questions answered prior to discharge.                  Additional history obtained: -Additional history obtained from spouse -External records from outside source obtained and reviewed including: Chart review including previous notes, labs, imaging, consultation notes including  Primary care documentation, prior urgent care documentation   Lab Tests: -I ordered, reviewed, and interpreted labs.   The  pertinent results include:   Labs Reviewed  URINALYSIS, ROUTINE W REFLEX MICROSCOPIC - Abnormal; Notable for the following components:      Result Value   Specific Gravity, Urine 1.032 (*)    Hgb urine dipstick MODERATE (*)    Protein, ur TRACE (*)    All other components within normal limits  PREGNANCY, URINE - Abnormal; Notable for the following components:   Preg Test, Ur POSITIVE (*)    All other components within normal limits    Notable for preg +  EKG   EKG Interpretation Date/Time:    Ventricular Rate:    PR Interval:    QRS Duration:    QT Interval:    QTC Calculation:   R Axis:      Text Interpretation:           Imaging Studies ordered: na   Medicines ordered and prescription drug management: No orders of the defined types were placed in this encounter.   -I have reviewed the patients home medicines and have made adjustments as needed   Consultations Obtained: na   Cardiac Monitoring: Continuous pulse oximetry interpreted by myself, 99% on RA.    Social Determinants of Health:  Diagnosis or treatment significantly limited by social determinants of health: no pcp   Reevaluation: After the interventions noted above, I reevaluated the patient and found that they have stayed the same  Co morbidities that complicate the patient evaluation  Past Medical History:  Diagnosis Date   Medical history non-contributory    Right  ureteral stone    Wears contact lenses       Dispostion: Disposition decision including need for hospitalization was considered, and patient discharged from emergency department.    Final Clinical Impression(s) / ED Diagnoses Final diagnoses:  Positive pregnancy test        Sloan Leiter, DO 01/18/23 2028

## 2023-01-19 ENCOUNTER — Other Ambulatory Visit (HOSPITAL_BASED_OUTPATIENT_CLINIC_OR_DEPARTMENT_OTHER): Payer: Self-pay

## 2023-02-27 ENCOUNTER — Other Ambulatory Visit (HOSPITAL_BASED_OUTPATIENT_CLINIC_OR_DEPARTMENT_OTHER): Payer: Self-pay

## 2023-02-27 MED ORDER — WEGOVY 0.5 MG/0.5ML ~~LOC~~ SOAJ
0.5000 mg | SUBCUTANEOUS | 0 refills | Status: DC
  Filled 2023-02-27: qty 2, 28d supply, fill #0

## 2023-03-09 ENCOUNTER — Other Ambulatory Visit (HOSPITAL_BASED_OUTPATIENT_CLINIC_OR_DEPARTMENT_OTHER): Payer: Self-pay

## 2023-03-19 ENCOUNTER — Ambulatory Visit: Payer: Federal, State, Local not specified - PPO | Admitting: Obstetrics and Gynecology

## 2023-04-03 ENCOUNTER — Emergency Department (HOSPITAL_COMMUNITY)
Admission: EM | Admit: 2023-04-03 | Discharge: 2023-04-03 | Disposition: A | Discharge status: Home / Self Care | Payer: Federal, State, Local not specified - PPO | Attending: Emergency Medicine | Admitting: Emergency Medicine

## 2023-04-03 ENCOUNTER — Emergency Department (HOSPITAL_COMMUNITY): Payer: Federal, State, Local not specified - PPO

## 2023-04-03 ENCOUNTER — Encounter (HOSPITAL_COMMUNITY): Payer: Self-pay | Admitting: Emergency Medicine

## 2023-04-03 DIAGNOSIS — I10 Essential (primary) hypertension: Secondary | ICD-10-CM | POA: Insufficient documentation

## 2023-04-03 DIAGNOSIS — R0789 Other chest pain: Secondary | ICD-10-CM | POA: Diagnosis present

## 2023-04-03 LAB — TROPONIN I (HIGH SENSITIVITY)
Troponin I (High Sensitivity): 3 ng/L (ref <18)
Troponin I (High Sensitivity): 2 ng/L (ref <18)

## 2023-04-03 LAB — CBC
HCT: 37.2 % (ref 36.0–46.0)
Hemoglobin: 11.9 g/dL — ABNORMAL LOW (ref 12.0–15.0)
MCH: 28.1 pg (ref 26.0–34.0)
MCHC: 32 g/dL (ref 30.0–36.0)
MCV: 87.9 fL (ref 80.0–100.0)
Platelets: 322 10*3/uL (ref 150–400)
RBC: 4.23 MIL/uL (ref 3.87–5.11)
RDW: 13.7 % (ref 11.5–15.5)
WBC: 13.6 10*3/uL — ABNORMAL HIGH (ref 4.0–10.5)
nRBC: 0 % (ref 0.0–0.2)

## 2023-04-03 LAB — BASIC METABOLIC PANEL
Anion gap: 10 (ref 5–15)
BUN: 19 mg/dL (ref 6–20)
CO2: 22 mmol/L (ref 22–32)
Calcium: 9.2 mg/dL (ref 8.9–10.3)
Chloride: 105 mmol/L (ref 98–111)
Creatinine, Ser: 0.82 mg/dL (ref 0.44–1.00)
GFR, Estimated: >60 mL/min (ref >60)
Glucose, Bld: 94 mg/dL (ref 70–99)
Potassium: 4 mmol/L (ref 3.5–5.1)
Sodium: 137 mmol/L (ref 135–145)

## 2023-04-03 NOTE — ED Triage Notes (Signed)
Pt reports chest pain that started 1 hour ago all of a sudden while driving. Pt also reports SHOB.

## 2023-04-03 NOTE — ED Provider Notes (Signed)
[place] EMERGENCY DEPARTMENT AT [facility] Provider Note   CSN: [id number] Arrival date & time: 04/03/23  1827     History  Chief Complaint  Patient presents with   Chest Pain    Misty Thompson is a 33 y.o. female.  Patient is a 33 year old female with history of hypertension.  Patient presenting today with complaints of chest discomfort.  She was driving in her car this evening when she experienced sharp pain to the center of her chest that made her feel short of breath.  There was no nausea, diaphoresis, or radiation to the arm or jaw.  This episode lasted for approximately 10 minutes, then resolved.  She has no prior cardiac history.  She has no cardiac risk factors.  No recent exertional symptoms.  The history is provided by the patient.       Home Medications Prior to Admission medications   Medication Sig Start Date End Date Taking? Authorizing Provider  Semaglutide-Weight Management (WEGOVY) 0.25 MG/0.5ML SOAJ Inject 0.25 mg into the skin every 7 (seven) days. 01/09/23     Semaglutide-Weight Management (WEGOVY) 0.25 MG/0.5ML SOAJ Inject 0.25 mg into the skin every 7 (seven) days. 01/09/23     Semaglutide-Weight Management (WEGOVY) 0.5 MG/0.5ML SOAJ Inject 0.5 mg into the skin once a week. 02/27/23     enalapril (VASOTEC) 5 MG tablet Take 1 tablet (5 mg total) by mouth daily. 11/25/17 09/21/19  Hermina Staggers, MD  medroxyPROGESTERone (DEPO-PROVERA) 150 MG/ML injection Inject 1 mL (150 mg total) into the muscle every 3 (three) months. 01/21/18 09/21/19  Tereso Newcomer, MD      Allergies    Patient has no known allergies.    Review of Systems   Review of Systems  All other systems reviewed and are negative.   Physical Exam Updated Vital Signs BP (!) 160/97   Pulse 79   Temp 98.6 F (37 C) (Oral)   Resp 16   LMP 12/19/2022 (Approximate)   SpO2 100%  Physical Exam Vitals and nursing note reviewed.  Constitutional:      General: She is not in  acute distress.    Appearance: She is well-developed. She is not diaphoretic.  HENT:     Head: Normocephalic and atraumatic.  Cardiovascular:     Rate and Rhythm: Normal rate and regular rhythm.     Heart sounds: No murmur heard.    No friction rub. No gallop.  Pulmonary:     Effort: Pulmonary effort is normal. No respiratory distress.     Breath sounds: Normal breath sounds. No wheezing.  Abdominal:     General: Bowel sounds are normal. There is no distension.     Palpations: Abdomen is soft.     Tenderness: There is no abdominal tenderness.  Musculoskeletal:        General: Normal range of motion.     Cervical back: Normal range of motion and neck supple.  Skin:    General: Skin is warm and dry.  Neurological:     General: No focal deficit present.     Mental Status: She is alert and oriented to person, place, and time.     ED Results / Procedures / Treatments   Labs (all labs ordered are listed, but only abnormal results are displayed) Labs Reviewed  CBC - Abnormal; Notable for the following components:      Result Value   WBC 13.6 (*)    Hemoglobin 11.9 (*)    All  other components within normal limits  BASIC METABOLIC PANEL  POC URINE PREG, ED  TROPONIN I (HIGH SENSITIVITY)  TROPONIN I (HIGH SENSITIVITY)    EKG EKG Interpretation Date/Time:  Friday April 03 2023 18:48:02 EST Ventricular Rate:  87 PR Interval:  164 QRS Duration:  80 QT Interval:  338 QTC Calculation: 406 R Axis:   24  Text Interpretation: Normal sinus rhythm with sinus arrhythmia Nonspecific T wave abnormality Abnormal ECG No previous ECGs available Confirmed by Geoffery Lyons ([id number]) on 04/03/2023 11:25:35 PM  Radiology DG Chest 2 View Result Date: 04/03/2023 CLINICAL DATA:  Chest pain and shortness of breath. EXAM: CHEST - 2 VIEW COMPARISON:  June 07, 2005 FINDINGS: The heart size and mediastinal contours are within normal limits. Both lungs are clear. The visualized skeletal structures  are unremarkable. IMPRESSION: No active cardiopulmonary disease. Electronically Signed   By: Aram Candela M.D.   On: 04/03/2023 19:26    Procedures Procedures    Medications Ordered in ED Medications - No data to display  ED Course/ Medical Decision Making/ A&P  Patient presenting with chest discomfort as described in the HPI.  Patient arrives here with stable vital signs and is asymptomatic.  There is no hypoxia.  Physical examination unremarkable.  Laboratory studies obtained including CBC, metabolic panel, and troponin, all of which are normal.  Chest x-ray is clear.  Patient presenting with chest discomfort that sounds atypical for cardiac pain, her workup is unremarkable, and I feel can safely be discharged.  Cause of her pain unclear, but appears to be nothing emergent.  Highly doubt pulmonary embolism, aortic dissection, no evidence for pneumothorax.  To return as needed for any problems.  Final Clinical Impression(s) / ED Diagnoses Final diagnoses:  None    Rx / DC Orders ED Discharge Orders     None         Geoffery Lyons, MD 04/03/23 2341

## 2023-04-03 NOTE — Discharge Instructions (Signed)
Return to the emergency department if your symptoms significantly worsen or change. 

## 2023-04-10 ENCOUNTER — Encounter (HOSPITAL_BASED_OUTPATIENT_CLINIC_OR_DEPARTMENT_OTHER): Payer: Self-pay

## 2023-04-10 ENCOUNTER — Other Ambulatory Visit (HOSPITAL_BASED_OUTPATIENT_CLINIC_OR_DEPARTMENT_OTHER): Payer: Self-pay

## 2023-04-10 MED ORDER — QSYMIA 15-92 MG PO CP24
1.0000 | ORAL_CAPSULE | Freq: Every day | ORAL | 0 refills | Status: DC
  Filled 2023-04-10 – 2023-04-21 (×3): qty 30, 30d supply, fill #0

## 2023-04-13 ENCOUNTER — Other Ambulatory Visit (HOSPITAL_BASED_OUTPATIENT_CLINIC_OR_DEPARTMENT_OTHER): Payer: Self-pay

## 2023-04-15 ENCOUNTER — Other Ambulatory Visit (HOSPITAL_BASED_OUTPATIENT_CLINIC_OR_DEPARTMENT_OTHER): Payer: Self-pay

## 2023-04-18 ENCOUNTER — Other Ambulatory Visit (HOSPITAL_BASED_OUTPATIENT_CLINIC_OR_DEPARTMENT_OTHER): Payer: Self-pay

## 2023-04-20 ENCOUNTER — Other Ambulatory Visit (HOSPITAL_BASED_OUTPATIENT_CLINIC_OR_DEPARTMENT_OTHER): Payer: Self-pay

## 2023-04-20 NOTE — Progress Notes (Deleted)
 ANNUAL EXAM Patient name: Misty Thompson MRN [id number]  Date of birth: 06-May-1990 Chief Complaint:   No chief complaint on file.  History of Present Illness:   Misty Thompson is a 33 y.o. G42P0 {race:25618} female being seen today for a routine annual exam.  Current complaints: ***  Patient's last menstrual period was 12/19/2022 (approximate).   The pregnancy intention screening data noted above was reviewed. Potential methods of contraception were discussed. The patient elected to proceed with No data recorded.   Last pap 03/23/17, NILM.  H/O abnormal pap: {yes/yes***/no:23866} Last mammogram: Not yet indicated due to age. Results were: {normal, abnormal, n/a:23837}. Family h/o breast cancer: {yes***/no:23838} Last colonoscopy: Not yet indicated due to age. Results were: {normal, abnormal, n/a:23837}. Family h/o colorectal cancer: {yes***/no:23838} STI screening:  Contraception:       No data to display               No data to display           Review of Systems:   Pertinent items are noted in HPI Denies any headaches, blurred vision, fatigue, shortness of breath, chest pain, abdominal pain, abnormal vaginal discharge/itching/odor/irritation, problems with periods, bowel movements, urination, or intercourse unless otherwise stated above. Pertinent History Reviewed:  Reviewed past medical,surgical, social and family history.  Reviewed problem list, medications and allergies. Physical Assessment:  There were no vitals filed for this visit.There is no height or weight on file to calculate BMI.        Physical Examination:   General appearance - well appearing, and in no distress  Mental status - alert, oriented to person, place, and time  Psych:  She has a normal mood and affect  Skin - warm and dry, normal color, no suspicious lesions noted  Chest - effort normal, all lung fields clear to auscultation bilaterally  Heart - normal rate and regular rhythm  Neck:   midline trachea, no thyromegaly or nodules  Breasts - breasts appear normal, no suspicious masses, no skin or nipple changes or  axillary nodes  Abdomen - soft, nontender, nondistended, no masses or organomegaly  Pelvic - VULVA: normal appearing vulva with no masses, tenderness or lesions  VAGINA: normal appearing vagina with normal color and discharge, no lesions  CERVIX: normal appearing cervix without discharge or lesions, no CMT  Thin prep pap is {Desc; done/not:10129} *** HR HPV cotesting  UTERUS: uterus is felt to be normal size, shape, consistency and nontender   ADNEXA: No adnexal masses or tenderness noted.  Extremities:  No swelling or varicosities noted  Chaperone present for exam  No results found for this or any previous visit (from the past 24 hours).  Assessment & Plan:  1. Encounter for annual routine gynecological examination (Primary) 2. Cervical cancer screening  - Cervical cancer screening: Discussed guidelines. Pap with HPV *** - Gardasil: {Blank single:19197::"***","has not yet had. Will provide information","completed","has not yet had. Counseling provided and she declines","Has not yet had. Counseling provided and pt accepts"} - GC/CT: {Blank single:19197::"accepts","declines","not indicated"} - Birth Control: {Birth control type:23956} - Breast Health: Encouraged self breast awareness/SBE. Teaching provided.  - Mammogram: {Mammo f/u:25212::"@ 31yo"}, or sooner if problems - Colonoscopy: {TCS f/u:25213::"@ 33yo"}, or sooner if problems - F/U 12 months and prn    No orders of the defined types were placed in this encounter.   Meds: No orders of the defined types were placed in this encounter.   Follow-up: No follow-ups on file.  Ralene Muskrat,  PA-C 04/20/2023 12:57 PM

## 2023-04-21 ENCOUNTER — Other Ambulatory Visit (HOSPITAL_BASED_OUTPATIENT_CLINIC_OR_DEPARTMENT_OTHER): Payer: Self-pay

## 2023-04-22 ENCOUNTER — Ambulatory Visit: Payer: Federal, State, Local not specified - PPO | Admitting: Physician Assistant

## 2023-04-22 DIAGNOSIS — Z124 Encounter for screening for malignant neoplasm of cervix: Secondary | ICD-10-CM

## 2023-04-22 DIAGNOSIS — Z01419 Encounter for gynecological examination (general) (routine) without abnormal findings: Secondary | ICD-10-CM

## 2023-05-12 NOTE — Progress Notes (Unsigned)
   ANNUAL EXAM Patient name: Misty Thompson MRN [id number]  Date of birth: Mar 19, 1990 Chief Complaint:   No chief complaint on file.  History of Present Illness:   Misty Thompson is a 33 y.o. G85P0 {race:25618} female being seen today for a routine annual exam.  Current complaints: ***  Patient's last menstrual period was 12/19/2022 (approximate).   The pregnancy intention screening data noted above was reviewed. Potential methods of contraception were discussed. The patient elected to proceed with No data recorded.   Last pap 03/23/2017 NILM HPV negative.  H/O abnormal pap: {yes/yes***/no:23866} Last mammogram: ***. Results were: {normal, abnormal, n/a:23837}. Family h/o breast cancer: {yes***/no:23838} Last colonoscopy: ***. Results were: {normal, abnormal, n/a:23837}. Family h/o colorectal cancer: {yes***/no:23838}      No data to display               No data to display           Review of Systems:   Pertinent items are noted in HPI Denies any headaches, blurred vision, fatigue, shortness of breath, chest pain, abdominal pain, abnormal vaginal discharge/itching/odor/irritation, problems with periods, bowel movements, urination, or intercourse unless otherwise stated above. Pertinent History Reviewed:  Reviewed past medical,surgical, social and family history.  Reviewed problem list, medications and allergies. Physical Assessment:  There were no vitals filed for this visit.There is no height or weight on file to calculate BMI.        Physical Examination:   General appearance - well appearing, and in no distress  Mental status - alert, oriented to person, place, and time  Psych:  She has a normal mood and affect  Skin - warm and dry, normal color, no suspicious lesions noted  Chest - effort normal, all lung fields clear to auscultation bilaterally  Heart - normal rate and regular rhythm  Neck:  midline trachea, no thyromegaly or nodules  Breasts - breasts appear  normal, no suspicious masses, no skin or nipple changes or  axillary nodes  Abdomen - soft, nontender, nondistended, no masses or organomegaly  Pelvic - VULVA: normal appearing vulva with no masses, tenderness or lesions  VAGINA: normal appearing vagina with normal color and discharge, no lesions  CERVIX: normal appearing cervix without discharge or lesions, no CMT  Thin prep pap is {Desc; done/not:10129} *** HR HPV cotesting  UTERUS: uterus is felt to be normal size, shape, consistency and nontender   ADNEXA: No adnexal masses or tenderness noted.  Extremities:  No swelling or varicosities noted  Chaperone present for exam  No results found for this or any previous visit (from the past 24 hours).  Assessment & Plan:  - Cervical cancer screening: Discussed guidelines. Pap with HPV *** - Gardasil: {Blank single:19197::"***","has not yet had. Will provide information","completed","has not yet had. Counseling provided and she declines","Has not yet had. Counseling provided and pt accepts"} - GC/CT: {Blank single:19197::"accepts","declines","not indicated"} - Birth Control: {Birth control type:23956} - Breast Health: Encouraged self breast awareness/SBE. Teaching provided.  - Mammogram: {Mammo f/u:25212::"@ 31yo"}, or sooner if problems - Colonoscopy: {TCS f/u:25213::"@ 33yo"}, or sooner if problems - F/U 12 months and prn   No orders of the defined types were placed in this encounter.   Meds: No orders of the defined types were placed in this encounter.   Follow-up: No follow-ups on file.  Ralene Muskrat, [place] 05/12/2023 6:40 PM

## 2023-05-15 ENCOUNTER — Encounter: Payer: Self-pay | Admitting: Physician Assistant

## 2023-05-15 ENCOUNTER — Other Ambulatory Visit (HOSPITAL_COMMUNITY)
Admission: RE | Admit: 2023-05-15 | Discharge: 2023-05-15 | Disposition: A | Discharge status: Home / Self Care | Source: Ambulatory Visit | Attending: Physician Assistant | Admitting: Physician Assistant

## 2023-05-15 ENCOUNTER — Ambulatory Visit (INDEPENDENT_AMBULATORY_CARE_PROVIDER_SITE_OTHER): Admitting: Physician Assistant

## 2023-05-15 VITALS — BP 142/96 | HR 69 | Ht 63.0 in | Wt 213.8 lb

## 2023-05-15 DIAGNOSIS — I10 Essential (primary) hypertension: Secondary | ICD-10-CM | POA: Diagnosis not present

## 2023-05-15 DIAGNOSIS — Z01419 Encounter for gynecological examination (general) (routine) without abnormal findings: Secondary | ICD-10-CM | POA: Diagnosis not present

## 2023-05-15 DIAGNOSIS — Z124 Encounter for screening for malignant neoplasm of cervix: Secondary | ICD-10-CM

## 2023-05-15 DIAGNOSIS — Z30011 Encounter for initial prescription of contraceptive pills: Secondary | ICD-10-CM | POA: Diagnosis not present

## 2023-05-15 DIAGNOSIS — Z3202 Encounter for pregnancy test, result negative: Secondary | ICD-10-CM

## 2023-05-15 LAB — POCT URINE PREGNANCY: Preg Test, Ur: NEGATIVE

## 2023-05-15 NOTE — Progress Notes (Signed)
 Pt presents for annual. Pt declines std testing and would like bc

## 2023-05-20 ENCOUNTER — Encounter: Payer: Self-pay | Admitting: Physician Assistant

## 2023-05-20 LAB — CYTOLOGY - PAP
Comment: NEGATIVE
Diagnosis: NEGATIVE
High risk HPV: NEGATIVE

## 2023-05-21 ENCOUNTER — Other Ambulatory Visit: Payer: Self-pay

## 2023-05-21 MED ORDER — METRONIDAZOLE 500 MG PO TABS: 500.0000 mg | ORAL_TABLET | Freq: Two times a day (BID) | ORAL | 0 refills | Status: DC

## 2023-05-21 NOTE — Progress Notes (Signed)
 Rx for flagyl sent per protocol

## 2023-06-10 ENCOUNTER — Encounter (HOSPITAL_COMMUNITY): Payer: Self-pay | Admitting: Emergency Medicine

## 2023-06-10 ENCOUNTER — Other Ambulatory Visit: Payer: Self-pay

## 2023-06-10 ENCOUNTER — Ambulatory Visit (HOSPITAL_COMMUNITY)
Admission: EM | Admit: 2023-06-10 | Discharge: 2023-06-10 | Disposition: A | Discharge status: Home / Self Care | Attending: Family Medicine | Admitting: Family Medicine

## 2023-06-10 DIAGNOSIS — M79652 Pain in left thigh: Secondary | ICD-10-CM

## 2023-06-10 DIAGNOSIS — Z3201 Encounter for pregnancy test, result positive: Secondary | ICD-10-CM | POA: Diagnosis not present

## 2023-06-10 LAB — POCT URINALYSIS DIP (MANUAL ENTRY)
Bilirubin, UA: NEGATIVE
Blood, UA: NEGATIVE
Glucose, UA: NEGATIVE mg/dL
Ketones, POC UA: NEGATIVE mg/dL
Leukocytes, UA: NEGATIVE
Nitrite, UA: NEGATIVE
Protein Ur, POC: NEGATIVE mg/dL
Spec Grav, UA: 1.02 (ref 1.010–1.025)
Urobilinogen, UA: 0.2 U/dL
pH, UA: 8 (ref 5.0–8.0)

## 2023-06-10 LAB — POCT URINE PREGNANCY: Preg Test, Ur: POSITIVE — AB

## 2023-06-10 NOTE — Discharge Instructions (Signed)
 Call your OB provider to set up an appointment.

## 2023-06-10 NOTE — ED Triage Notes (Signed)
 Reports pain in anterior left thigh.  Reports throbbing pain  Patient also complains of frequent urination.  Pain in lower back.  Denies burning with urination.    Has taken urinary relief pill, cranberry juice and heating pad.

## 2023-06-10 NOTE — ED Provider Notes (Signed)
 [facility]   [id number] 06/10/23 Arrival Time: 1103  ASSESSMENT & PLAN:  1. Acute pain of left thigh   2. Positive pregnancy test    Pt reports same pain in thigh when last pregnant. Benign abdomen.    Discharge Instructions      Call your OB provider to set up an appointment.      Follow-up Information     Go to  [facility] ([facility]).   Why: If symptoms worsen in any way. Contact information: [address] [place],  [place]  [address]                Reviewed expectations re: course of current medical issues. Questions answered. Outlined signs and symptoms indicating need for more acute intervention. Understanding verbalized. After Visit Summary given.   SUBJECTIVE: History from: Patient. Martavia Reiser Selbe is a 33 y.o. female. Reports pain in anterior left thigh.  Reports throbbing pain  Patient also complains of frequent urination.  Pain in lower back.  Denies burning with urination.    Has taken urinary relief pill, cranberry juice and heating pad.   Denies: fever. Normal PO intake without n/v/d.  OBJECTIVE:  Vitals:   06/10/23 1153  BP: 139/86  Pulse: 84  Resp: 18  Temp: 98.9 F (37.2 C)  TempSrc: Oral  SpO2: 98%    General appearance: alert; no distress Abd: S/NT Extremities: no edema; mild "soreness" over left anterior thigh Skin: warm and dry Neurologic: normal gait Psychological: alert and cooperative; normal mood and affect  Labs: Results for orders placed or performed during the hospital encounter of 06/10/23  POCT urinalysis dipstick   Collection Time: 06/10/23 12:12 PM  Result Value Ref Range   Color, UA yellow yellow   Clarity, UA clear clear   Glucose, UA negative negative mg/dL   Bilirubin, UA negative negative   Ketones, POC UA negative negative mg/dL   Spec Grav, UA 1.914 7.829 - 1.025   Blood, UA negative negative   pH, UA 8.0 5.0 - 8.0   Protein Ur, POC negative  negative mg/dL   Urobilinogen, UA 0.2 0.2 or 1.0 E.U./dL   Nitrite, UA Negative Negative   Leukocytes, UA Negative Negative  POCT urine pregnancy   Collection Time: 06/10/23 12:12 PM  Result Value Ref Range   Preg Test, Ur Positive (A) Negative   Labs Reviewed  POCT URINE PREGNANCY - Abnormal; Notable for the following components:      Result Value   Preg Test, Ur Positive (*)    All other components within normal limits  POCT URINALYSIS DIP (MANUAL ENTRY)    Imaging: No results found.  No Known Allergies  Past Medical History:  Diagnosis Date   Medical history non-contributory    Right ureteral stone    Wears contact lenses    Social History   Socioeconomic History   Marital status: Single    Spouse name: Not on file   Number of children: Not on file   Years of education: Not on file   Highest education level: Associate degree: occupational, Scientist, product/process development, or vocational program  Occupational History   Not on file  Tobacco Use   Smoking status: Never   Smokeless tobacco: Never  Vaping Use   Vaping status: Never Used  Substance and Sexual Activity   Alcohol use: Not Currently   Drug use: No   Sexual activity: Not Currently    Partners: Male    Birth  control/protection: None  Other Topics Concern   Not on file  Social History Narrative   Not on file   Social Drivers of Health   Financial Resource Strain: Low Risk  (09/24/2017)   Overall Financial Resource Strain (CARDIA)    Difficulty of Paying Living Expenses: Not hard at all  Food Insecurity: No Food Insecurity (09/24/2017)   Hunger Vital Sign    Worried About Running Out of Food in the Last Year: Never true    Ran Out of Food in the Last Year: Never true  Transportation Needs: No Transportation Needs (09/24/2017)   PRAPARE - Administrator, Civil Service (Medical): No    Lack of Transportation (Non-Medical): No  Physical Activity: Insufficiently Active (09/30/2017)   Exercise Vital Sign    Days  of Exercise per Week: 2 days    Minutes of Exercise per Session: 30 min  Stress: No Stress Concern Present (09/24/2017)   [employer] of Occupational Health - Occupational Stress Questionnaire    Feeling of Stress : Not at all  Social Connections: Unknown (09/30/2017)   Social Connection and Isolation Panel [NHANES]    Frequency of Communication with Friends and Family: More than three times a week    Frequency of Social Gatherings with Friends and Family: Once a week    Attends Religious Services: Not on Marketing executive or Organizations: Not on file    Attends Banker Meetings: 1 to 4 times per year    Marital Status: Never married  Intimate Partner Violence: Not At Risk (09/24/2017)   Humiliation, Afraid, Rape, and Kick questionnaire    Fear of Current or Ex-Partner: No    Emotionally Abused: No    Physically Abused: No    Sexually Abused: No   Family History  Problem Relation Age of Onset   Hypertension Mother    Diabetes Maternal Grandfather    Past Surgical History:  Procedure Laterality Date   CYSTOSCOPY WITH RETROGRADE PYELOGRAM, URETEROSCOPY AND STENT PLACEMENT Right 08/05/2013   Procedure: CYSTOSCOPY WITH RETROGRADE PYELOGRAM, URETEROSCOPY AND STENT PLACEMENT;  Surgeon: Arleen Bells, MD;  Location: [facility];  Service: Urology;  Laterality: Right;   WISDOM TOOTH EXTRACTION  MARCH 2015     Afton Albright, MD 06/10/23 1610

## 2023-06-29 ENCOUNTER — Other Ambulatory Visit (HOSPITAL_BASED_OUTPATIENT_CLINIC_OR_DEPARTMENT_OTHER): Payer: Self-pay

## 2023-06-29 MED ORDER — AMLODIPINE BESYLATE 5 MG PO TABS
5.0000 mg | ORAL_TABLET | Freq: Every day | ORAL | 0 refills | Status: DC
  Filled 2023-06-29: qty 30, 30d supply, fill #0

## 2023-07-21 ENCOUNTER — Other Ambulatory Visit (HOSPITAL_BASED_OUTPATIENT_CLINIC_OR_DEPARTMENT_OTHER): Payer: Self-pay

## 2023-07-21 MED ORDER — AMLODIPINE BESYLATE 5 MG PO TABS
5.0000 mg | ORAL_TABLET | Freq: Every day | ORAL | 1 refills | Status: DC
  Filled 2023-07-21 – 2023-07-22 (×2): qty 90, 90d supply, fill #0

## 2023-07-22 ENCOUNTER — Other Ambulatory Visit (HOSPITAL_BASED_OUTPATIENT_CLINIC_OR_DEPARTMENT_OTHER): Payer: Self-pay

## 2023-08-01 ENCOUNTER — Other Ambulatory Visit (HOSPITAL_BASED_OUTPATIENT_CLINIC_OR_DEPARTMENT_OTHER): Payer: Self-pay

## 2023-08-10 ENCOUNTER — Other Ambulatory Visit (HOSPITAL_BASED_OUTPATIENT_CLINIC_OR_DEPARTMENT_OTHER): Payer: Self-pay

## 2023-08-10 MED ORDER — NORGESTIMATE-ETH ESTRADIOL 0.25-35 MG-MCG PO TABS
1.0000 | ORAL_TABLET | Freq: Every day | ORAL | 3 refills | Status: DC
  Filled 2023-08-10: qty 84, 84d supply, fill #0

## 2023-10-23 ENCOUNTER — Other Ambulatory Visit (HOSPITAL_BASED_OUTPATIENT_CLINIC_OR_DEPARTMENT_OTHER): Payer: Self-pay

## 2023-10-23 MED ORDER — PHENTERMINE HCL 15 MG PO CAPS
15.0000 mg | ORAL_CAPSULE | Freq: Every day | ORAL | 0 refills | Status: DC
  Filled 2023-10-23: qty 30, 30d supply, fill #0

## 2023-10-23 MED ORDER — VITAMIN D (ERGOCALCIFEROL) 1.25 MG (50000 UNIT) PO CAPS
50000.0000 [IU] | ORAL_CAPSULE | ORAL | 5 refills | Status: AC
  Filled 2023-10-23: qty 4, 28d supply, fill #0
  Filled 2023-11-23: qty 4, 28d supply, fill #1
  Filled 2024-01-01: qty 4, 28d supply, fill #2
  Filled 2024-02-17: qty 4, 28d supply, fill #3

## 2023-10-23 MED ORDER — AMLODIPINE BESYLATE 5 MG PO TABS
5.0000 mg | ORAL_TABLET | Freq: Every day | ORAL | 1 refills | Status: AC
  Filled 2023-10-23: qty 90, 90d supply, fill #0

## 2023-10-24 ENCOUNTER — Other Ambulatory Visit (HOSPITAL_BASED_OUTPATIENT_CLINIC_OR_DEPARTMENT_OTHER): Payer: Self-pay

## 2023-10-26 ENCOUNTER — Other Ambulatory Visit (HOSPITAL_BASED_OUTPATIENT_CLINIC_OR_DEPARTMENT_OTHER): Payer: Self-pay

## 2023-10-30 ENCOUNTER — Other Ambulatory Visit (HOSPITAL_BASED_OUTPATIENT_CLINIC_OR_DEPARTMENT_OTHER): Payer: Self-pay

## 2023-11-23 ENCOUNTER — Ambulatory Visit: Admitting: Obstetrics

## 2023-11-23 ENCOUNTER — Other Ambulatory Visit (HOSPITAL_BASED_OUTPATIENT_CLINIC_OR_DEPARTMENT_OTHER): Payer: Self-pay

## 2023-11-23 ENCOUNTER — Encounter: Payer: Self-pay | Admitting: Obstetrics

## 2023-11-23 ENCOUNTER — Other Ambulatory Visit: Payer: Self-pay

## 2023-11-23 VITALS — BP 136/93 | HR 86 | Ht 63.0 in | Wt 224.5 lb

## 2023-11-23 DIAGNOSIS — Z3041 Encounter for surveillance of contraceptive pills: Secondary | ICD-10-CM | POA: Diagnosis not present

## 2023-11-23 DIAGNOSIS — R52 Pain, unspecified: Secondary | ICD-10-CM

## 2023-11-23 DIAGNOSIS — E569 Vitamin deficiency, unspecified: Secondary | ICD-10-CM

## 2023-11-23 DIAGNOSIS — E669 Obesity, unspecified: Secondary | ICD-10-CM | POA: Diagnosis not present

## 2023-11-23 MED ORDER — PNV TABS 29-1 29-1 MG PO TABS
1.0000 | ORAL_TABLET | Freq: Every day | ORAL | 3 refills | Status: AC
  Filled 2023-11-23: qty 90, fill #0

## 2023-11-23 MED ORDER — IBUPROFEN 800 MG PO TABS
800.0000 mg | ORAL_TABLET | Freq: Three times a day (TID) | ORAL | 0 refills | Status: AC | PRN
  Filled 2023-11-23: qty 30, 10d supply, fill #0

## 2023-11-23 MED ORDER — LEVONORGESTREL-ETHINYL ESTRAD 0.15-30 MG-MCG PO TABS
1.0000 | ORAL_TABLET | Freq: Every day | ORAL | 11 refills | Status: AC
  Filled 2023-11-23: qty 28, 28d supply, fill #0
  Filled 2024-01-01: qty 28, 28d supply, fill #1
  Filled 2024-02-17: qty 28, 28d supply, fill #2

## 2023-11-23 NOTE — Progress Notes (Unsigned)
 Pt presents for bc refill.

## 2023-11-23 NOTE — Progress Notes (Unsigned)
 Subjective:    Misty Thompson is a 33 y.o. female who presents for contraception counseling. The patient has no complaints today. The patient {is/is not/has never been:13135} sexually active. Pertinent past medical history: {contraception pert [id number].  The information documented in the HPI was reviewed and verified.  Menstrual History: OB History     Gravida  3   Para      Term      Preterm      AB      Living  0      SAB      IAB      Ectopic      Multiple      Live Births              Menarche age: *** No LMP recorded (lmp unknown). Patient is pregnant.   Patient Active Problem List   Diagnosis Date Noted   Contraception management 11/25/2017   Hypertension 11/25/2017   Past Medical History:  Diagnosis Date   Medical history non-contributory    Right ureteral stone    Wears contact lenses     Past Surgical History:  Procedure Laterality Date   CYSTOSCOPY WITH RETROGRADE PYELOGRAM, URETEROSCOPY AND STENT PLACEMENT Right 08/05/2013   Procedure: CYSTOSCOPY WITH RETROGRADE PYELOGRAM, URETEROSCOPY AND STENT PLACEMENT;  Surgeon: Oliva VEAR Oiler, MD;  Location[facility];  Service: Urology;  Laterality: Right;   WISDOM TOOTH EXTRACTION  MARCH 2015     Current Outpatient Medications:    ibuprofen  (IBU) 800 MG tablet, Take 1 tablet (800 mg total) by mouth every 8 (eight) hours as needed., Disp: 30 tablet, Rfl: 0   levonorgestrel-ethinyl estradiol  (NORDETTE) 0.15-30 MG-MCG tablet, Take 1 tablet by mouth daily., Disp: 28 tablet, Rfl: 11   phentermine  15 MG capsule, Take 1 capsule (15 mg total) by mouth daily., Disp: 30 capsule, Rfl: 0   Prenatal Vit-Iron Carbonyl-FA (PNV TABS 29-1) 29-1 MG TABS, Take 1 tablet by mouth daily before breakfast., Disp: 90 tablet, Rfl: 3   Vitamin D , Ergocalciferol , (DRISDOL ) 1.25 MG (50000 UNIT) CAPS capsule, Take 1 capsule (50,000 Units total) by mouth once a week., Disp: 4 capsule, Rfl: 5   amLODipine  (NORVASC ) 5  MG tablet, Take 1 tablet (5 mg total) by mouth daily., Disp: 90 tablet, Rfl: 1   amLODipine  (NORVASC ) 5 MG tablet, Take 1 tablet (5 mg total) by mouth daily., Disp: 90 tablet, Rfl: 1 No Known Allergies  Social History   Tobacco Use   Smoking status: Never   Smokeless tobacco: Never  Substance Use Topics   Alcohol use: Not Currently    Family History  Problem Relation Age of Onset   Hypertension Mother    Diabetes Maternal Grandfather        Review of Systems Constitutional: negative for weight loss Genitourinary:negative for abnormal menstrual periods and vaginal discharge ***  Objective:   BP (!) 136/93   Pulse 86   Ht 5' 3 (1.6 m)   Wt 224 lb 8 oz (101.8 kg)   LMP  (LMP Unknown)   BMI 39.77 kg/m    General:   alert  Skin:   no rash or abnormalities  Lungs:   clear to auscultation bilaterally  Heart:   regular rate and rhythm, S1, S2 normal, no murmur, click, rub or gallop  Breasts:   normal without suspicious masses, skin or nipple changes or axillary nodes  Abdomen:  normal findings: no organomegaly, soft, non-tender and no hernia  Pelvis:  External genitalia: normal general appearance Urinary system: urethral meatus normal and bladder without fullness, nontender Vaginal: normal without tenderness, induration or masses Cervix: normal appearance Adnexa: normal bimanual exam Uterus: anteverted and non-tender, normal size   Lab Review Urine pregnancy test Labs reviewed {YES NO:22349} Radiologic studies reviewed {YES NO:22349}  ***% of *** min visit spent on counseling and coordination of care.    Assessment:    33 y.o., {starting/continuing/discont:14600} {contraceptive method:5051}, {contraindications/ no contra:14601}.   Plan:    {gyn plan:13146} Meds ordered this encounter  Medications   levonorgestrel-ethinyl estradiol  (NORDETTE) 0.15-30 MG-MCG tablet    Sig: Take 1 tablet by mouth daily.    Dispense:  28 tablet    Refill:  11   Prenatal  Vit-Iron Carbonyl-FA (PNV TABS 29-1) 29-1 MG TABS    Sig: Take 1 tablet by mouth daily before breakfast.    Dispense:  90 tablet    Refill:  3   ibuprofen  (IBU) 800 MG tablet    Sig: Take 1 tablet (800 mg total) by mouth every 8 (eight) hours as needed.    Dispense:  30 tablet    Refill:  0   No orders of the defined types were placed in this encounter.  Need to obtain previous records Follow up as needed. ***   CARLIN RONAL CENTERS, MD, FACOG Attending Obstetrician & Gynecologist, [facility] for [facility], [facility], [place] 11/23/2023

## 2023-12-01 ENCOUNTER — Ambulatory Visit: Admitting: Obstetrics and Gynecology

## 2023-12-15 ENCOUNTER — Other Ambulatory Visit: Payer: Self-pay

## 2023-12-15 ENCOUNTER — Emergency Department (HOSPITAL_BASED_OUTPATIENT_CLINIC_OR_DEPARTMENT_OTHER)

## 2023-12-15 ENCOUNTER — Other Ambulatory Visit (HOSPITAL_BASED_OUTPATIENT_CLINIC_OR_DEPARTMENT_OTHER): Payer: Self-pay

## 2023-12-15 ENCOUNTER — Emergency Department (HOSPITAL_BASED_OUTPATIENT_CLINIC_OR_DEPARTMENT_OTHER): Admitting: Radiology

## 2023-12-15 ENCOUNTER — Emergency Department (HOSPITAL_BASED_OUTPATIENT_CLINIC_OR_DEPARTMENT_OTHER)
Admission: EM | Admit: 2023-12-15 | Discharge: 2023-12-15 | Disposition: A | Discharge status: Home / Self Care | Attending: Emergency Medicine | Admitting: Emergency Medicine

## 2023-12-15 ENCOUNTER — Encounter (HOSPITAL_BASED_OUTPATIENT_CLINIC_OR_DEPARTMENT_OTHER): Payer: Self-pay

## 2023-12-15 DIAGNOSIS — R079 Chest pain, unspecified: Secondary | ICD-10-CM | POA: Diagnosis present

## 2023-12-15 DIAGNOSIS — M546 Pain in thoracic spine: Secondary | ICD-10-CM | POA: Diagnosis not present

## 2023-12-15 DIAGNOSIS — N63 Unspecified lump in unspecified breast: Secondary | ICD-10-CM

## 2023-12-15 DIAGNOSIS — K8020 Calculus of gallbladder without cholecystitis without obstruction: Secondary | ICD-10-CM

## 2023-12-15 LAB — HEPATIC FUNCTION PANEL
ALT: 8 U/L (ref 0–44)
AST: 21 U/L (ref 15–41)
Albumin: 4.3 g/dL (ref 3.5–5.0)
Alkaline Phosphatase: 65 U/L (ref 38–126)
Bilirubin, Direct: 0.1 mg/dL (ref 0.0–0.2)
Indirect Bilirubin: 0.2 mg/dL — ABNORMAL LOW (ref 0.3–0.9)
Total Bilirubin: 0.4 mg/dL (ref 0.0–1.2)
Total Protein: 7.6 g/dL (ref 6.5–8.1)

## 2023-12-15 LAB — BASIC METABOLIC PANEL WITH GFR
Anion gap: 11 (ref 5–15)
BUN: 12 mg/dL (ref 6–20)
CO2: 23 mmol/L (ref 22–32)
Calcium: 9.6 mg/dL (ref 8.9–10.3)
Chloride: 103 mmol/L (ref 98–111)
Creatinine, Ser: 0.92 mg/dL (ref 0.44–1.00)
GFR, Estimated: >60 mL/min (ref >60)
Glucose, Bld: 106 mg/dL — ABNORMAL HIGH (ref 70–99)
Potassium: 4 mmol/L (ref 3.5–5.1)
Sodium: 136 mmol/L (ref 135–145)

## 2023-12-15 LAB — LIPASE, BLOOD: Lipase: 20 U/L (ref 11–51)

## 2023-12-15 LAB — CBC
HCT: 35.1 % — ABNORMAL LOW (ref 36.0–46.0)
Hemoglobin: 11.6 g/dL — ABNORMAL LOW (ref 12.0–15.0)
MCH: 28.7 pg (ref 26.0–34.0)
MCHC: 33 g/dL (ref 30.0–36.0)
MCV: 86.9 fL (ref 80.0–100.0)
Platelets: 349 K/uL (ref 150–400)
RBC: 4.04 MIL/uL (ref 3.87–5.11)
RDW: 13.3 % (ref 11.5–15.5)
WBC: 12.4 K/uL — ABNORMAL HIGH (ref 4.0–10.5)
nRBC: 0 % (ref 0.0–0.2)

## 2023-12-15 LAB — D-DIMER, QUANTITATIVE: D-Dimer, Quant: 0.61 ug{FEU}/mL — ABNORMAL HIGH (ref 0.00–0.50)

## 2023-12-15 LAB — TROPONIN T, HIGH SENSITIVITY
Troponin T High Sensitivity: <15 ng/L (ref 0–19)
Troponin T High Sensitivity: <15 ng/L (ref 0–19)

## 2023-12-15 LAB — PREGNANCY, URINE: Preg Test, Ur: NEGATIVE

## 2023-12-15 MED ORDER — ONDANSETRON HCL 4 MG/2ML IJ SOLN
4.0000 mg | Freq: Once | INTRAMUSCULAR | Status: AC
  Administered 2023-12-15: 4 mg via INTRAVENOUS
  Filled 2023-12-15: qty 2

## 2023-12-15 MED ORDER — ONDANSETRON 4 MG PO TBDP
ORAL_TABLET | ORAL | 0 refills | Status: AC
  Filled 2023-12-15: qty 8, 2d supply, fill #0

## 2023-12-15 MED ORDER — KETOROLAC TROMETHAMINE 15 MG/ML IJ SOLN
30.0000 mg | Freq: Once | INTRAMUSCULAR | Status: AC
  Administered 2023-12-15: 30 mg via INTRAMUSCULAR
  Filled 2023-12-15: qty 2

## 2023-12-15 MED ORDER — HYDROCODONE-ACETAMINOPHEN 5-325 MG PO TABS
1.0000 | ORAL_TABLET | ORAL | 0 refills | Status: DC | PRN
  Filled 2023-12-15: qty 12, 1d supply, fill #0

## 2023-12-15 MED ORDER — IOHEXOL 350 MG/ML SOLN
100.0000 mL | Freq: Once | INTRAVENOUS | Status: AC | PRN
  Administered 2023-12-15: 80 mL via INTRAVENOUS

## 2023-12-15 MED ORDER — MORPHINE SULFATE (PF) 4 MG/ML IV SOLN
4.0000 mg | Freq: Once | INTRAVENOUS | Status: AC
  Administered 2023-12-15: 4 mg via INTRAVENOUS
  Filled 2023-12-15: qty 1

## 2023-12-15 NOTE — ED Notes (Signed)
 Brought pt. A bag of popcorn, saltine crackers, and a lemon lime shasta

## 2023-12-15 NOTE — ED Triage Notes (Signed)
 Arrives POV. Reports pain/pressure under L breast and mid upper back starting at 2100. Mild SOB and chest tightness.

## 2023-12-15 NOTE — Discharge Instructions (Addendum)
 Make an appointment to have close follow-up with the surgery group listed above.  Return to emergency room if you have any worsening symptoms such as worsening pain, vomiting, fevers or other worsening symptoms.  You do have a nodule in your left breast that needs outpatient follow-up.  Follow-up with your OB/GYN regarding this.

## 2023-12-15 NOTE — ED Provider Notes (Signed)
 [facility]ERGENCY [facility] Emergency Department Provider Note MRN:  [id number]  Arrival date & time: 12/15/23     Chief Complaint   Chest Pain and Back Pain   History of Present Illness   Misty Thompson is a 33 y.o. year-old female with no pertinent past medical history presenting to the ED with chief complaint of chest pain and back pain.  Pain to the right chest beneath the breast also with pain to the right thoracic back.  Not going away, hurting all night.  Hurts to move and breathe.  Review of Systems  A thorough review of systems was obtained and all systems are negative except as noted in the HPI and PMH.   Patient's Health History    Past Medical History:  Diagnosis Date   Medical history non-contributory    Right ureteral stone    Wears contact lenses     Past Surgical History:  Procedure Laterality Date   CYSTOSCOPY WITH RETROGRADE PYELOGRAM, URETEROSCOPY AND STENT PLACEMENT Right 08/05/2013   Procedure: CYSTOSCOPY WITH RETROGRADE PYELOGRAM, URETEROSCOPY AND STENT PLACEMENT;  Surgeon: Oliva VEAR Oiler, MD;  Location: [facility];  Service: Urology;  Laterality: Right;   WISDOM TOOTH EXTRACTION  MARCH 2015    Family History  Problem Relation Age of Onset   Hypertension Mother    Diabetes Maternal Grandfather     Social History   Socioeconomic History   Marital status: Single    Spouse name: Not on file   Number of children: Not on file   Years of education: Not on file   Highest education level: Associate degree: occupational, scientist, product/process development, or vocational program  Occupational History   Not on file  Tobacco Use   Smoking status: Never   Smokeless tobacco: Never  Vaping Use   Vaping status: Never Used  Substance and Sexual Activity   Alcohol use: Not Currently   Drug use: No   Sexual activity: Not Currently    Partners: Male    Birth control/protection: None  Other Topics Concern   Not on file  Social History Narrative   Not on  file   Social Drivers of Health   Financial Resource Strain: Low Risk  (09/24/2017)   Overall Financial Resource Strain (CARDIA)    Difficulty of Paying Living Expenses: Not hard at all  Food Insecurity: No Food Insecurity (09/24/2017)   Hunger Vital Sign    Worried About Running Out of Food in the Last Year: Never true    Ran Out of Food in the Last Year: Never true  Transportation Needs: No Transportation Needs (09/24/2017)   PRAPARE - Administrator, Civil Service (Medical): No    Lack of Transportation (Non-Medical): No  Physical Activity: Insufficiently Active (09/30/2017)   Exercise Vital Sign    Days of Exercise per Week: 2 days    Minutes of Exercise per Session: 30 min  Stress: No Stress Concern Present (09/24/2017)   [employer] of Occupational Health - Occupational Stress Questionnaire    Feeling of Stress : Not at all  Social Connections: Unknown (09/30/2017)   Social Connection and Isolation Panel    Frequency of Communication with Friends and Family: More than three times a week    Frequency of Social Gatherings with Friends and Family: Once a week    Attends Religious Services: Not on Insurance Claims Handler of Clubs or Organizations: Not on file    Attends Banker Meetings: 1 to  4 times per year    Marital Status: Never married  Intimate Partner Violence: Not At Risk (09/24/2017)   Humiliation, Afraid, Rape, and Kick questionnaire    Fear of Current or Ex-Partner: No    Emotionally Abused: No    Physically Abused: No    Sexually Abused: No     Physical Exam   Vitals:   12/15/23 0555 12/15/23 0600  BP: (!) 155/82 (!) 156/85  Pulse: 88 66  Resp: 18 18  Temp:  98.1 F (36.7 C)  SpO2: 100% 100%    CONSTITUTIONAL: Well-appearing, NAD NEURO/PSYCH:  Alert and oriented x 3, no focal deficits EYES:  eyes equal and reactive ENT/NECK:  no LAD, no JVD CARDIO: Regular rate, well-perfused, normal S1 and S2 PULM:  CTAB no wheezing or  rhonchi GI/GU:  non-distended, non-tender MSK/SPINE:  No gross deformities, no edema SKIN:  no rash, atraumatic   *Additional and/or pertinent findings included in MDM below  Diagnostic and Interventional Summary    EKG Interpretation Date/Time:  Tuesday December 15 2023 01:46:32 EST Ventricular Rate:  69 PR Interval:  182 QRS Duration:  78 QT Interval:  358 QTC Calculation: 383 R Axis:   59  Text Interpretation: Normal sinus rhythm Normal ECG When compared with ECG of 03-Apr-2023 18:48, No significant change was found Confirmed by Theadore Sharper [phone]) on 12/15/2023 4:42:28 AM       Labs Reviewed  BASIC METABOLIC PANEL WITH GFR - Abnormal; Notable for the following components:      Result Value   Glucose, Bld 106 (*)    All other components within normal limits  CBC - Abnormal; Notable for the following components:   WBC 12.4 (*)    Hemoglobin 11.6 (*)    HCT 35.1 (*)    All other components within normal limits  D-DIMER, QUANTITATIVE - Abnormal; Notable for the following components:   D-Dimer, Quant 0.61 (*)    All other components within normal limits  PREGNANCY, URINE  TROPONIN T, HIGH SENSITIVITY  TROPONIN T, HIGH SENSITIVITY    DG Chest 2 View  Final Result    CT Angio Chest Pulmonary Embolism (PE) W or WO Contrast    (Results Pending)    Medications  ketorolac  (TORADOL ) 15 MG/ML injection 30 mg (30 mg Intramuscular Given 12/15/23 0449)  iohexol  (OMNIPAQUE ) 350 MG/ML injection 100 mL (80 mLs Intravenous Contrast Given 12/15/23 9360)     Procedures  /  Critical Care Procedures  ED Course and Medical Decision Making  Initial Impression and Ddx Somewhat pleuritic chest pain, could be MSK but she is also on birth control pills and so PE is considered, felt to be low risk.  Awaiting dimer.  Past medical/surgical history that increases complexity of ED encounter: None  Interpretation of Diagnostics I personally reviewed the EKG and my interpretation is as  follows: Sinus rhythm without concerning features  No significant blood count or electrolyte disturbance.  Troponin negative, D-dimer positive  Patient Reassessment and Ultimate Disposition/Management     Awaiting PE study, signed out to oncoming provider at shift change.  Patient management required discussion with the following services or consulting groups:  None  Complexity of Problems Addressed Acute illness or injury that poses threat of life of bodily function  Additional Data Reviewed and Analyzed Further history obtained from: None  Additional Factors Impacting ED Encounter Risk Consideration of hospitalization  Sharper HERO. Theadore, MD [facility] Emergency Medicine [facility] [email or web address]  Final Clinical Impressions(s) /  ED Diagnoses     ICD-10-CM   1. Chest pain, unspecified type  R07.9       ED Discharge Orders     None        Discharge Instructions Discussed with and Provided to Patient:   Discharge Instructions   None      Theadore Ozell HERO, MD 12/15/23 [phone]

## 2023-12-15 NOTE — ED Provider Notes (Signed)
 Patient care was taken over from Dr. Vannie.  Patient presented with some right sided pleuritic chest pain.  Awaiting CTA of the chest.  This was negative for PE.  There was a nodule in the left breast.  I did discuss follow-up with her OB/GYN regarding this and she does have an OB/GYN that she can follow-up with.  On reexam.  She was still complaining of pain.  It seems to be now in her epigastrium and right upper quadrant under the left breast.  Ultrasound ordered which shows cholelithiasis without evidence of cholecystitis.  LFTs are normal.  Her white count is mildly elevated but she is afebrile.  No evidence of pancreatitis.  She tolerated p.o. challenge well.  I suspect this is the etiology for her pain.  Will refer her to have close follow-up with surgery.  She was given a prescription for a short course of pain medication with nausea minutes.  Return precautions were given.   Misty Hollering, MD 12/15/23 1110

## 2023-12-15 NOTE — ED Notes (Signed)
Called lab to add on hepatic function and lipase 

## 2023-12-18 NOTE — Progress Notes (Signed)
 Sent message, via epic in basket, requesting orders in epic from Careers adviser.

## 2023-12-21 ENCOUNTER — Ambulatory Visit: Payer: Self-pay | Admitting: General Surgery

## 2023-12-24 NOTE — Patient Instructions (Signed)
 SURGICAL WAITING ROOM VISITATION  Patients having surgery or a procedure may have no more than 2 support people in the waiting area - these visitors may rotate.    Children under the age of 14 must have an adult with them who is not the patient.  Visitors with respiratory illnesses are discouraged from visiting and should remain at home.  If the patient needs to stay at the hospital during part of their recovery, the visitor guidelines for inpatient rooms apply. Pre-op nurse will coordinate an appropriate time for 1 support person to accompany patient in pre-op.  This support person may not rotate.    Please refer to the [facility] website for the visitor guidelines for Inpatients (after your surgery is over and you are in a regular room).    Your procedure is scheduled on: 12/31/23   Report to [facility] Main Entrance    Report to admitting at 5:15 AM   Call this number if you have problems the morning of surgery [phone]   Do not eat food :After Midnight.   After Midnight you may have the following liquids until 4:30 AM DAY OF SURGERY  Water Non-Citrus Juices (without pulp, NO RED-Apple, White grape, White cranberry) Black Coffee (NO MILK/CREAM OR CREAMERS, sugar ok)  Clear Tea (NO MILK/CREAM OR CREAMERS, sugar ok) regular and decaf                             Plain Jell-O (NO RED)                                           Fruit ices (not with fruit pulp, NO RED)                                     Popsicles (NO RED)                                                               Sports drinks like Gatorade (NO RED)                      If you have questions, please contact your surgeon's office.   FOLLOW BOWEL PREP AND ANY ADDITIONAL PRE OP INSTRUCTIONS YOU RECEIVED FROM YOUR SURGEON'S OFFICE!!!     Oral Hygiene is also important to reduce your risk of infection.                                    Remember - BRUSH YOUR TEETH THE MORNING OF SURGERY WITH YOUR  REGULAR TOOTHPASTE  DENTURES WILL BE REMOVED PRIOR TO SURGERY PLEASE DO NOT APPLY Poly grip OR ADHESIVES!!!   Stop all vitamins and herbal supplements 7 days before surgery.   Take these medicines the morning of surgery with A SIP OF WATER: Amlodipine   You may not have any metal on your body including hair pins, jewelry, and body piercing             Do not wear make-up, lotions, powders, perfumes, or deodorant  Do not wear nail polish including gel and S&S, artificial/acrylic nails, or any other type of covering on natural nails including finger and toenails. If you have artificial nails, gel coating, etc. that needs to be removed by a nail salon please have this removed prior to surgery or surgery may need to be canceled/ delayed if the surgeon/ anesthesia feels like they are unable to be safely monitored.   Do not shave  48 hours prior to surgery.    Do not bring valuables to the hospital. [place] IS NOT             RESPONSIBLE   FOR VALUABLES.   Contacts, glasses, dentures or bridgework may not be worn into surgery.  DO NOT BRING YOUR HOME MEDICATIONS TO THE HOSPITAL. PHARMACY WILL DISPENSE MEDICATIONS LISTED ON YOUR MEDICATION LIST TO YOU DURING YOUR ADMISSION IN THE HOSPITAL!    Special Instructions: Bring a copy of your healthcare power of attorney and living will documents the day of surgery if you haven't scanned them before.              Please read over the following fact sheets you were given: IF YOU HAVE QUESTIONS ABOUT YOUR PRE-OP INSTRUCTIONS PLEASE CALL [phone] [place].   If you received a COVID test during your pre-op visit  it is requested that you wear a mask when out in public, stay away from anyone that may not be feeling well and notify your surgeon if you develop symptoms. If you test positive for Covid or have been in contact with anyone that has tested positive in the last 10 days please notify you surgeon.    [place]  - Preparing for Surgery Before surgery, you can play an important role.  Because skin is not sterile, your skin needs to be as free of germs as possible.  You can reduce the number of germs on your skin by washing with CHG (chlorahexidine gluconate) soap before surgery.  CHG is an antiseptic cleaner which kills germs and bonds with the skin to continue killing germs even after washing. Please DO NOT use if you have an allergy to CHG or antibacterial soaps.  If your skin becomes reddened/irritated stop using the CHG and inform your nurse when you arrive at Short Stay. Do not shave (including legs and underarms) for at least 48 hours prior to the first CHG shower.  You may shave your face/neck.  Please follow these instructions carefully:  1.  Shower with CHG Soap the night before surgery ONLY (DO NOT USE THE SOAP THE MORNING OF SURGERY).  2.  If you choose to wash your hair, wash your hair first as usual with your normal  shampoo.  3.  After you shampoo, rinse your hair and body thoroughly to remove the shampoo.                             4.  Use CHG as you would any other liquid soap.  You can apply chg directly to the skin and wash.  Gently with a scrungie or clean washcloth.  5.  Apply the CHG Soap to your body ONLY FROM THE NECK DOWN.   Do   not use on  face/ open                           Wound or open sores. Avoid contact with eyes, ears mouth and   genitals (private parts).                       Wash face,  Genitals (private parts) with your normal soap.             6.  Wash thoroughly, paying special attention to the area where your    surgery  will be performed.  7.  Thoroughly rinse your body with warm water from the neck down.  8.  DO NOT shower/wash with your normal soap after using and rinsing off the CHG Soap.                9.  Pat yourself dry with a clean towel.            10.  Wear clean pajamas.            11.  Place clean sheets on your bed the night of your first shower and do  not  sleep with pets. Day of Surgery : Do not apply any CHG, lotions/deodorants the morning of surgery.  Please wear clean clothes to the hospital/surgery center.  FAILURE TO FOLLOW THESE INSTRUCTIONS MAY RESULT IN THE CANCELLATION OF YOUR SURGERY  PATIENT SIGNATURE_________________________________  NURSE SIGNATURE__________________________________  ________________________________________________________________________

## 2023-12-24 NOTE — Progress Notes (Signed)
 Date of COVID positive in last 90 days:  PCP - [facility]  Cardiologist - n/a  Chest x-ray - 12/15/23 Epic EKG - 12/15/23 Epic Stress Test - N/A ECHO - N/A Cardiac Cath - N/A Pacemaker/ICD device last checked:N/A Spinal Cord Stimulator:N/A  Bowel Prep - N/A  Sleep Study - N/A CPAP -   Fasting Blood Sugar - N/A Checks Blood Sugar _____ times a day  Last dose of GLP1 agonist-  N/A GLP1 instructions:  Do not take after     Last dose of SGLT-2 inhibitors-  N/A SGLT-2 instructions:  Do not take after     Blood Thinner Instructions: N/A Last dose:   Time: Aspirin  Instructions:N/A Last Dose:  Activity level:  Can go up a flight of stairs and perform activities of daily living without stopping and without symptoms of chest pain or shortness of breath.  Able to exercise without symptoms  Unable to go up a flight of stairs without symptoms of     Anesthesia review: N/A  Patient denies shortness of breath, fever, cough and chest pain at PAT appointment  Patient verbalized understanding of instructions that were given to them at the PAT appointment. Patient was also instructed that they will need to review over the PAT instructions again at home before surgery.

## 2023-12-25 ENCOUNTER — Encounter (HOSPITAL_COMMUNITY)
Admission: RE | Admit: 2023-12-25 | Discharge: 2023-12-25 | Disposition: A | Discharge status: Home / Self Care | Source: Ambulatory Visit | Attending: General Surgery | Admitting: General Surgery

## 2023-12-25 ENCOUNTER — Other Ambulatory Visit: Payer: Self-pay

## 2023-12-25 ENCOUNTER — Encounter (HOSPITAL_COMMUNITY): Payer: Self-pay

## 2023-12-25 DIAGNOSIS — Z01818 Encounter for other preprocedural examination: Secondary | ICD-10-CM | POA: Insufficient documentation

## 2023-12-25 HISTORY — DX: Essential (primary) hypertension: I10

## 2023-12-30 NOTE — Anesthesia Preprocedure Evaluation (Signed)
 Anesthesia Evaluation  Patient identified by MRN, date of birth, ID band Patient awake    Reviewed: Allergy & Precautions, NPO status , Patient's Chart, lab work & pertinent test results  Airway Mallampati: III  TM Distance: >3 FB Neck ROM: Full    Dental  (+) Teeth Intact, Dental Advisory Given   Pulmonary neg pulmonary ROS   Pulmonary exam normal breath sounds clear to auscultation       Cardiovascular hypertension, Pt. on medications Normal cardiovascular exam Rhythm:Regular Rate:Normal  EKG 12/15/23 NSR, Normal   Neuro/Psych negative neurological ROS  negative psych ROS   GI/Hepatic Lab Results      Component                Value               Date                      ALT                      8                   12/15/2023                AST                      21                  12/15/2023                ALKPHOS                  65                  12/15/2023                BILITOT                  0.4                 12/15/2023            Cholelithiasis - symptomatic   Endo/Other  Obesity  Renal/GU negative Renal ROSLab Results      Component                Value               Date                      NA                       136                 12/15/2023                CL                       103                 12/15/2023                K                        4.0  12/15/2023                CO2                      23                  12/15/2023                BUN                      12                  12/15/2023                CREATININE               0.92                12/15/2023                GFRNONAA                 >60                 12/15/2023                CALCIUM                  9.6                 12/15/2023                ALBUMIN                  4.3                 12/15/2023                GLUCOSE                  106 (H)             12/15/2023             negative  genitourinary   Musculoskeletal negative musculoskeletal ROS (+)    Abdominal  (+) + obese  Peds  Hematology  (+) Blood dyscrasia, anemia Lab Results      Component                Value               Date                      WBC                      12.4 (H)            12/15/2023                HGB                      11.6 (L)            12/15/2023                HCT                      35.1 (L)            12/15/2023  MCV                      86.9                12/15/2023                PLT                      349                 12/15/2023              Anesthesia Other Findings   Reproductive/Obstetrics negative OB ROS                              Anesthesia Physical Anesthesia Plan  ASA: 2  Anesthesia Plan: General   Post-op Pain Management: Dilaudid  IV, Precedex and Tylenol  PO (pre-op)*   Induction: Intravenous and Cricoid pressure planned  PONV Risk Score and Plan: 4 or greater and Treatment may vary due to age or medical condition, Scopolamine  patch - Pre-op, Midazolam , Ondansetron  and Dexamethasone   Airway Management Planned: Oral ETT  Additional Equipment: None  Intra-op Plan:   Post-operative Plan: Extubation in OR  Informed Consent: I have reviewed the patients History and Physical, chart, labs and discussed the procedure including the risks, benefits and alternatives for the proposed anesthesia with the patient or authorized representative who has indicated his/her understanding and acceptance.     Dental advisory given  Plan Discussed with: CRNA and Anesthesiologist  Anesthesia Plan Comments:          Anesthesia Quick Evaluation

## 2023-12-31 ENCOUNTER — Ambulatory Visit (HOSPITAL_COMMUNITY)
Admission: RE | Admit: 2023-12-31 | Discharge: 2023-12-31 | Disposition: A | Discharge status: Home / Self Care | Source: Ambulatory Visit | Attending: General Surgery | Admitting: General Surgery

## 2023-12-31 ENCOUNTER — Encounter (HOSPITAL_COMMUNITY): Payer: Self-pay | Admitting: General Surgery

## 2023-12-31 ENCOUNTER — Ambulatory Visit (HOSPITAL_COMMUNITY): Payer: Self-pay | Admitting: Physician Assistant

## 2023-12-31 ENCOUNTER — Ambulatory Visit (HOSPITAL_COMMUNITY): Payer: Self-pay | Admitting: Anesthesiology

## 2023-12-31 ENCOUNTER — Encounter (HOSPITAL_COMMUNITY)
Admission: RE | Disposition: A | Discharge status: Home / Self Care | Payer: Self-pay | Source: Ambulatory Visit | Attending: General Surgery

## 2023-12-31 DIAGNOSIS — K8010 Calculus of gallbladder with chronic cholecystitis without obstruction: Secondary | ICD-10-CM | POA: Insufficient documentation

## 2023-12-31 DIAGNOSIS — I10 Essential (primary) hypertension: Secondary | ICD-10-CM | POA: Diagnosis not present

## 2023-12-31 DIAGNOSIS — Z6838 Body mass index (BMI) 38.0-38.9, adult: Secondary | ICD-10-CM | POA: Diagnosis not present

## 2023-12-31 DIAGNOSIS — E669 Obesity, unspecified: Secondary | ICD-10-CM | POA: Insufficient documentation

## 2023-12-31 DIAGNOSIS — Z87442 Personal history of urinary calculi: Secondary | ICD-10-CM | POA: Insufficient documentation

## 2023-12-31 HISTORY — PX: CHOLECYSTECTOMY: SHX55

## 2023-12-31 LAB — POCT PREGNANCY, URINE: Preg Test, Ur: NEGATIVE

## 2023-12-31 SURGERY — LAPAROSCOPIC CHOLECYSTECTOMY
Anesthesia: General | Site: Abdomen

## 2023-12-31 MED ORDER — BUPIVACAINE-EPINEPHRINE (PF) 0.25% -1:200000 IJ SOLN
INTRAMUSCULAR | Status: AC
  Filled 2023-12-31: qty 30

## 2023-12-31 MED ORDER — OXYCODONE HCL 5 MG/5ML PO SOLN: 5.0000 mg | Freq: Once | ORAL | Status: AC | PRN

## 2023-12-31 MED ORDER — ACETAMINOPHEN 500 MG PO TABS
1000.0000 mg | ORAL_TABLET | ORAL | Status: AC
  Administered 2023-12-31: 1000 mg via ORAL
  Filled 2023-12-31: qty 2

## 2023-12-31 MED ORDER — HYDROMORPHONE HCL 1 MG/ML IJ SOLN
INTRAMUSCULAR | Status: AC
  Filled 2023-12-31: qty 1

## 2023-12-31 MED ORDER — BUPIVACAINE-EPINEPHRINE 0.25% -1:200000 IJ SOLN
INTRAMUSCULAR | Status: DC | PRN
Start: 2023-12-31 — End: 2023-12-31
  Administered 2023-12-31: 30 mL

## 2023-12-31 MED ORDER — ONDANSETRON HCL 4 MG/2ML IJ SOLN: 4.0000 mg | Freq: Once | INTRAMUSCULAR | Status: DC | PRN

## 2023-12-31 MED ORDER — ESMOLOL HCL 100 MG/10ML IV SOLN
INTRAVENOUS | Status: AC
  Filled 2023-12-31: qty 10

## 2023-12-31 MED ORDER — LACTATED RINGERS IR SOLN
Status: DC | PRN
  Administered 2023-12-31: 1000 mL

## 2023-12-31 MED ORDER — PROPOFOL 10 MG/ML IV BOLUS
INTRAVENOUS | Status: AC
Start: 2023-12-31 — End: 2023-12-31
  Filled 2023-12-31: qty 20

## 2023-12-31 MED ORDER — OXYCODONE HCL 5 MG PO TABS
5.0000 mg | ORAL_TABLET | Freq: Once | ORAL | Status: AC | PRN
  Administered 2023-12-31: 5 mg via ORAL

## 2023-12-31 MED ORDER — LACTATED RINGERS IV SOLN
INTRAVENOUS | Status: DC | PRN
Start: 2023-12-31 — End: 2023-12-31

## 2023-12-31 MED ORDER — MIDAZOLAM HCL 2 MG/2ML IJ SOLN
INTRAMUSCULAR | Status: AC
  Filled 2023-12-31: qty 2

## 2023-12-31 MED ORDER — SUGAMMADEX SODIUM 200 MG/2ML IV SOLN
INTRAVENOUS | Status: AC
  Filled 2023-12-31: qty 2

## 2023-12-31 MED ORDER — ONDANSETRON HCL 4 MG/2ML IJ SOLN
INTRAMUSCULAR | Status: AC
  Filled 2023-12-31: qty 2

## 2023-12-31 MED ORDER — SCOPOLAMINE 1 MG/3DAYS TD PT72
1.0000 | MEDICATED_PATCH | Freq: Once | TRANSDERMAL | Status: DC
  Administered 2023-12-31: 1 mg via TRANSDERMAL
  Filled 2023-12-31: qty 1

## 2023-12-31 MED ORDER — PHENYLEPHRINE HCL (PRESSORS) 10 MG/ML IV SOLN
INTRAVENOUS | Status: AC
  Filled 2023-12-31: qty 1

## 2023-12-31 MED ORDER — CHLORHEXIDINE GLUCONATE CLOTH 2 % EX PADS: 6.0000 | MEDICATED_PAD | Freq: Once | CUTANEOUS | Status: DC

## 2023-12-31 MED ORDER — ORAL CARE MOUTH RINSE: 15.0000 mL | Freq: Once | OROMUCOSAL | Status: AC

## 2023-12-31 MED ORDER — MIDAZOLAM HCL 5 MG/5ML IJ SOLN
INTRAMUSCULAR | Status: DC | PRN
  Administered 2023-12-31: 2 mg via INTRAVENOUS

## 2023-12-31 MED ORDER — FENTANYL CITRATE (PF) 100 MCG/2ML IJ SOLN
INTRAMUSCULAR | Status: AC
  Filled 2023-12-31: qty 2

## 2023-12-31 MED ORDER — ROCURONIUM BROMIDE 100 MG/10ML IV SOLN
INTRAVENOUS | Status: DC | PRN
  Administered 2023-12-31: 50 mg via INTRAVENOUS

## 2023-12-31 MED ORDER — HYDROMORPHONE HCL 1 MG/ML IJ SOLN
0.2500 mg | INTRAMUSCULAR | Status: DC | PRN
  Administered 2023-12-31 (×2): 0.5 mg via INTRAVENOUS

## 2023-12-31 MED ORDER — LACTATED RINGERS IV SOLN: INTRAVENOUS | Status: DC

## 2023-12-31 MED ORDER — OXYCODONE HCL 5 MG PO TABS: 5.0000 mg | ORAL_TABLET | Freq: Four times a day (QID) | ORAL | 0 refills | Status: AC | PRN

## 2023-12-31 MED ORDER — DROPERIDOL 2.5 MG/ML IJ SOLN: 0.6250 mg | Freq: Once | INTRAMUSCULAR | Status: DC | PRN

## 2023-12-31 MED ORDER — LIDOCAINE HCL (PF) 2 % IJ SOLN
INTRAMUSCULAR | Status: AC
  Filled 2023-12-31: qty 5

## 2023-12-31 MED ORDER — KETOROLAC TROMETHAMINE 30 MG/ML IJ SOLN
INTRAMUSCULAR | Status: AC
  Filled 2023-12-31: qty 1

## 2023-12-31 MED ORDER — 0.9 % SODIUM CHLORIDE (POUR BTL) OPTIME
TOPICAL | Status: DC | PRN
  Administered 2023-12-31: 1000 mL

## 2023-12-31 MED ORDER — FENTANYL CITRATE (PF) 100 MCG/2ML IJ SOLN
INTRAMUSCULAR | Status: DC | PRN
  Administered 2023-12-31 (×2): 100 ug via INTRAVENOUS

## 2023-12-31 MED ORDER — CEFAZOLIN SODIUM-DEXTROSE 2-4 GM/100ML-% IV SOLN
2.0000 g | INTRAVENOUS | Status: AC
  Administered 2023-12-31: 2 g via INTRAVENOUS
  Filled 2023-12-31: qty 100

## 2023-12-31 MED ORDER — SUGAMMADEX SODIUM 200 MG/2ML IV SOLN
INTRAVENOUS | Status: DC | PRN
  Administered 2023-12-31: 200 mg via INTRAVENOUS

## 2023-12-31 MED ORDER — CHLORHEXIDINE GLUCONATE 0.12 % MT SOLN
15.0000 mL | Freq: Once | OROMUCOSAL | Status: AC
  Administered 2023-12-31: 15 mL via OROMUCOSAL

## 2023-12-31 MED ORDER — SPY AGENT GREEN - (INDOCYANINE FOR INJECTION)
1.2500 mg | Freq: Once | INTRAMUSCULAR | Status: AC
  Administered 2023-12-31: 1.25 mg via INTRAVENOUS

## 2023-12-31 MED ORDER — PROPOFOL 10 MG/ML IV BOLUS
INTRAVENOUS | Status: DC | PRN
  Administered 2023-12-31: 200 mg via INTRAVENOUS

## 2023-12-31 MED ORDER — OXYCODONE HCL 5 MG PO TABS
ORAL_TABLET | ORAL | Status: AC
  Filled 2023-12-31: qty 1

## 2023-12-31 MED ORDER — DEXAMETHASONE SOD PHOSPHATE PF 10 MG/ML IJ SOLN
INTRAMUSCULAR | Status: DC | PRN
  Administered 2023-12-31: 10 mg via INTRAVENOUS

## 2023-12-31 MED ORDER — ACETAMINOPHEN 500 MG PO TABS: 1000.0000 mg | ORAL_TABLET | Freq: Three times a day (TID) | ORAL | Status: AC

## 2023-12-31 MED ORDER — KETOROLAC TROMETHAMINE 30 MG/ML IJ SOLN
INTRAMUSCULAR | Status: DC | PRN
  Administered 2023-12-31: 30 mg via INTRAVENOUS

## 2023-12-31 MED ORDER — GLYCOPYRROLATE 0.2 MG/ML IJ SOLN
INTRAMUSCULAR | Status: DC | PRN
  Administered 2023-12-31: .2 mg via INTRAVENOUS

## 2023-12-31 MED ORDER — LIDOCAINE HCL (CARDIAC) PF 100 MG/5ML IV SOSY
PREFILLED_SYRINGE | INTRAVENOUS | Status: DC | PRN
  Administered 2023-12-31: 100 mg via INTRAVENOUS

## 2023-12-31 MED ORDER — ROCURONIUM BROMIDE 10 MG/ML (PF) SYRINGE
PREFILLED_SYRINGE | INTRAVENOUS | Status: AC
  Filled 2023-12-31: qty 10

## 2023-12-31 SURGICAL SUPPLY — 41 items
APPLICATOR ARISTA FLEXITIP XL (MISCELLANEOUS) IMPLANT
BAG COUNTER SPONGE SURGICOUNT (BAG) IMPLANT
CHLORAPREP W/TINT 26 (MISCELLANEOUS) ×2 IMPLANT
CLIP APPLIE 5 13 M/L LIGAMAX5 (MISCELLANEOUS) IMPLANT
CLIP APPLIE ROT 10 11.4 M/L (STAPLE) IMPLANT
CLIP LIGATING HEMO O LOK GREEN (MISCELLANEOUS) IMPLANT
COVER MAYO STAND XLG (MISCELLANEOUS) IMPLANT
COVER SURGICAL LIGHT HANDLE (MISCELLANEOUS) ×2 IMPLANT
DERMABOND ADVANCED .7 DNX12 (GAUZE/BANDAGES/DRESSINGS) IMPLANT
DRAPE C-ARM 42X120 X-RAY (DRAPES) IMPLANT
DRSG TEGADERM 2-3/8X2-3/4 SM (GAUZE/BANDAGES/DRESSINGS) ×6 IMPLANT
DRSG TEGADERM 4X4.75 (GAUZE/BANDAGES/DRESSINGS) ×2 IMPLANT
ELECT REM PT RETURN 15FT ADLT (MISCELLANEOUS) ×2 IMPLANT
GAUZE SPONGE 2X2 8PLY STRL LF (GAUZE/BANDAGES/DRESSINGS) ×1 IMPLANT
GLOVE BIO SURGEON STRL SZ7.5 (GLOVE) ×2 IMPLANT
GLOVE INDICATOR 8.0 STRL GRN (GLOVE) ×2 IMPLANT
GOWN STRL REUS W/ TWL XL LVL3 (GOWN DISPOSABLE) ×2 IMPLANT
GRASPER SUT TROCAR 14GX15 (MISCELLANEOUS) IMPLANT
HEMOSTAT ARISTA ABSORB 3G PWDR (HEMOSTASIS) IMPLANT
HEMOSTAT SNOW SURGICEL 2X4 (HEMOSTASIS) IMPLANT
IRRIGATION SUCT STRKRFLW 2 WTP (MISCELLANEOUS) ×2 IMPLANT
KIT BASIN OR (CUSTOM PROCEDURE TRAY) ×2 IMPLANT
KIT IMAGING PINPOINTPAQ (MISCELLANEOUS) IMPLANT
KIT TURNOVER KIT A (KITS) ×2 IMPLANT
POUCH RETRIEVAL ECOSAC 10 (ENDOMECHANICALS) ×2 IMPLANT
SCISSORS LAP 5X35 DISP (ENDOMECHANICALS) ×2 IMPLANT
SET CHOLANGIOGRAPH MIX (MISCELLANEOUS) IMPLANT
SET TUBE SMOKE EVAC HIGH FLOW (TUBING) ×2 IMPLANT
SLEEVE ADV FIXATION 5X100MM (TROCAR) ×2 IMPLANT
SPIKE FLUID TRANSFER (MISCELLANEOUS) ×2 IMPLANT
STRIP CLOSURE SKIN 1/2X4 (GAUZE/BANDAGES/DRESSINGS) ×2 IMPLANT
SUT MNCRL AB 4-0 PS2 18 (SUTURE) ×2 IMPLANT
SUT VIC AB 0 UR5 27 (SUTURE) IMPLANT
SUT VICRYL 0 TIES 12 18 (SUTURE) IMPLANT
SUT VICRYL 0 UR6 27IN ABS (SUTURE) ×2 IMPLANT
TOWEL OR DSP ST BLU DLX 10/PK (DISPOSABLE) ×2 IMPLANT
TRAY LAPAROSCOPIC (CUSTOM PROCEDURE TRAY) ×2 IMPLANT
TROCAR ADV FIXATION 12X100MM (TROCAR) IMPLANT
TROCAR ADV FIXATION 5X100MM (TROCAR) ×2 IMPLANT
TROCAR BALLN 12MMX100 BLUNT (TROCAR) IMPLANT
TROCAR XCEL NON-BLD 5MMX100MML (ENDOMECHANICALS) IMPLANT

## 2023-12-31 NOTE — Interval H&P Note (Signed)
 History and Physical Interval Note:  12/31/2023 7:09 AM  Misty Thompson  has presented today for surgery, with the diagnosis of SYMPTOMATIC CHOLELITHIASIS.  The various methods of treatment have been discussed with the patient and family. After consideration of risks, benefits and other options for treatment, the patient has consented to  Procedure(s): LAPAROSCOPIC CHOLECYSTECTOMY (N/A) as a surgical intervention.  The patient's history has been reviewed, patient examined, no change in status, stable for surgery.  I have reviewed the patient's chart and labs.  Questions were answered to the patient's satisfaction.     Camellia Blush

## 2023-12-31 NOTE — Anesthesia Postprocedure Evaluation (Signed)
 Anesthesia Post Note  Patient: Misty Thompson  Procedure(s) Performed: LAPAROSCOPIC CHOLECYSTECTOMY (Abdomen)     Patient location during evaluation: PACU Anesthesia Type: General Level of consciousness: awake and alert and oriented Pain management: pain level controlled Vital Signs Assessment: post-procedure vital signs reviewed and stable Respiratory status: spontaneous breathing, nonlabored ventilation and respiratory function stable Cardiovascular status: blood pressure returned to baseline and stable Postop Assessment: no apparent nausea or vomiting Anesthetic complications: no   No notable events documented.  Last Vitals:  Vitals:   12/31/23 1000 12/31/23 1015  BP: (!) 173/90 (!) 155/85  Pulse: 84 (!) 108  Resp: 20 18  Temp: 36.6 C 36.6 C  SpO2: 97% 99%    Last Pain:  Vitals:   12/31/23 1015  TempSrc:   PainSc: 3                  Wylie Russon A.

## 2023-12-31 NOTE — Anesthesia Procedure Notes (Signed)
 Procedure Name: Intubation Date/Time: 12/31/2023 7:29 AM  Performed by: Branton Einstein, Corean BROCKS, CRNAPre-anesthesia Checklist: Patient identified, Emergency Drugs available, Suction available and Patient being monitored Patient Re-evaluated:Patient Re-evaluated prior to induction Oxygen Delivery Method: Circle system utilized Preoxygenation: Pre-oxygenation with 100% oxygen Induction Type: IV induction Ventilation: Mask ventilation without difficulty Laryngoscope Size: Mac and 3 Grade View: Grade I Tube type: Oral Tube size: 7.0 mm Number of attempts: 1 Airway Equipment and Method: Stylet and Oral airway Placement Confirmation: ETT inserted through vocal cords under direct vision, positive ETCO2 and breath sounds checked- equal and bilateral Secured at: 21 cm Tube secured with: Tape Dental Injury: Teeth and Oropharynx as per pre-operative assessment

## 2023-12-31 NOTE — Op Note (Signed)
 Misty Thompson [id number] 12-Dec-1990 12/31/2023  Laparoscopic Cholecystectomy with near infrared fluorescent cholangiography procedure Note  Indications: This patient presents with symptomatic gallbladder disease and will undergo laparoscopic cholecystectomy.  Pre-operative Diagnosis: symptomatic cholelithiasis  Post-operative Diagnosis: Same  Surgeon: Camellia Blush MD FACS  Assistants: none  Anesthesia: General endotracheal anesthesia  Procedure Details  The patient was seen again in the Holding Room. The risks, benefits, complications, treatment options, and expected outcomes were discussed with the patient. The possibilities of reaction to medication, pulmonary aspiration, perforation of viscus, bleeding, recurrent infection, finding a normal gallbladder, the need for additional procedures, failure to diagnose a condition, the possible need to convert to an open procedure, and creating a complication requiring transfusion or operation were discussed with the patient. The likelihood of improving the patient's symptoms with return to their baseline status is good.  The patient and/or family concurred with the proposed plan, giving informed consent. The site of surgery properly noted. The patient was taken to Operating Room, identified as Misty Thompson and the procedure verified as Laparoscopic Cholecystectomy with ICG dye.  A Time Out was held and the above information confirmed. Antibiotic prophylaxis was administered.    ICG dye was administered preoperatively.    General endotracheal anesthesia was then administered and tolerated well. After the induction, the abdomen was prepped with Chloraprep and draped in the sterile fashion. The patient was positioned in the supine position.  Local anesthetic agent was injected into the skin near the umbilicus and an incision made. We dissected down to the abdominal fascia with blunt dissection.  The fascia was incised vertically and we entered the  peritoneal cavity bluntly.  A pursestring suture of 0-Vicryl was placed around the fascial opening.  The Hasson cannula was inserted and secured with the stay suture.  Pneumoperitoneum was then created with CO2 and tolerated well without any adverse changes in the patient's vital signs. An 5-mm port was placed in the subxiphoid position.  Two 5-mm ports were placed in the right upper quadrant. All skin incisions were infiltrated with a local anesthetic agent before making the incision and placing the trocars.   We positioned the patient in reverse Trendelenburg, tilted slightly to the patient's left.  The gallbladder was identified, the fundus grasped and retracted cephalad. Adhesions were lysed bluntly and with the electrocautery where indicated, taking care not to injure any adjacent organs or viscus. The infundibulum was grasped and retracted laterally, exposing the peritoneum overlying the triangle of Calot. This was then divided and exposed in a blunt fashion. A critical view of the cystic duct and cystic artery was obtained.  The cystic duct was clearly identified and bluntly dissected circumferentially.  Utilizing the Stryker camera system near infrared fluorescent activity was visualized in the liver, cystic duct, common hepatic duct and common bile duct and small bowel.  This served as a secondary confirmation of our anatomy.  The cystic duct was then ligated with clips and divided. The cystic artery which had been identified & dissected free was ligated with clips and divided as well.   The gallbladder was dissected from the liver bed in retrograde fashion with the electrocautery. The gallbladder was removed and placed in an Ecco sac.  The gallbladder and Ecco sac were then removed through the umbilical port site. The liver bed was irrigated and inspected. Hemostasis was achieved with the electrocautery. Copious irrigation was utilized and was repeatedly aspirated until clear.  The pursestring suture  was used to close the umbilical fascia.  An additional interrupted 0 Vicryl was placed using a PMI suture passer with laparoscopic guidance  We again inspected the right upper quadrant for hemostasis.  The umbilical closure was inspected and there was no air leak and nothing trapped within the closure. Pneumoperitoneum was released as we removed the trocars.  4-0 Monocryl was used to close the skin.   Steri strips, gauze and tegaderm was applied. The patient was then extubated and brought to the recovery room in stable condition. Instrument, sponge, and needle counts were correct at closure and at the conclusion of the case.   Findings: Possible early Cholecystitis with Cholelithiasis critical view Infrared fluorescent cholangiography visualized within the common hepatic duct, common bile duct, cystic duct and small bowel  Estimated Blood Loss: Minimal         Drains: none         Specimens: Gallbladder           Complications: None; patient tolerated the procedure well.         Disposition: PACU - hemodynamically stable.         Condition: stable  Camellia HERO. Tanda, MD, FACS General, Bariatric, & Minimally Invasive Surgery [facility],  [facility]

## 2023-12-31 NOTE — H&P (Signed)
 REFERRING PHYSICIAN: Self  PROVIDER: Keirsten Matuska Misty BLUSH, MD  MRN: [id number] DOB: 1991/01/09 DATE OF ENCOUNTER: 12/17/2023  Subjective  Chief Complaint: New Patient (New patient gallbladder w/o cholecystitis w/o obstruction )   Reason for consult: Misty Thompson is a 33 y.o. female who is seen today as an office consultation at the request of Dr. Arch for evaluation of New Patient (New patient gallbladder w/o cholecystitis w/o obstruction ) .  History of Present Illness Misty Thompson is a 33 year old female with a history of kidney stones who presents with right upper quadrant pain and nausea.  She experiences pain under her right breast radiating to the side of her back, similar to previous kidney stone pain but located higher. She also has nausea, vomiting, and felt hot. She visited the emergency room for these symptoms.  In the emergency room, she received medication for nausea and pain, including hydrocodone . Since then, she has not had episodes as intense as the initial one, though she took pain medication once yesterday. Her urine has turned orange since last night.  Her past medical history includes a procedure for a kidney stone in 2016. She denies any other abdominal surgeries. She takes a prenatal multivitamin, Altavera for birth control, and a 5 mg medication for blood pressure, though she cannot recall the name.  No diarrhea, constipation, or blood in her urine. She confirms eating and drinking normally since the emergency room visit, though finds it painful to lay back due to tenderness in the area of the initial pain.    Review of Systems: A complete review of systems was obtained from the patient. I have reviewed this information and discussed as appropriate with the patient. See HPI as well for other ROS.  ROS  Medical History: Past Medical History: Diagnosis Date Hypertension  There is no problem list on file for this patient.  History reviewed. No pertinent  surgical history.  No Known Allergies  Current Outpatient Medications on File Prior to Visit Medication Sig Dispense Refill amLODIPine  (NORVASC ) 5 MG tablet Take 5 mg by mouth once daily phentermine  (ADIPEX-P ) 15 MG capsule Take 15 mg by mouth once daily prenatal vit,cal 76-iron-folic 29 mg iron- 1 mg Tab Take by mouth  No current facility-administered medications on file prior to visit.  Family History Problem Relation Age of Onset High blood pressure (Hypertension) Mother   Social History  Tobacco Use Smoking Status Never Smokeless Tobacco Never   Social History  Socioeconomic History Marital status: Single Tobacco Use Smoking status: Never Smokeless tobacco: Never Substance and Sexual Activity Drug use: Never  Social Drivers of Catering Manager Strain: Low Risk (09/24/2017) Received from [facility] Overall Financial Resource Strain (CARDIA) Difficulty of Paying Living Expenses: Not hard at all Food Insecurity: No Food Insecurity (09/24/2017) Received from [facility] Hunger Vital Sign Worried About Running Out of Food in the Last Year: Never true Ran Out of Food in the Last Year: Never true Transportation Needs: No Transportation Needs (09/24/2017) Received from [facility] - Transportation Lack of Transportation (Medical): No Lack of Transportation (Non-Medical): No Physical Activity: Insufficiently Active (09/30/2017) Received from [facility] Exercise Vital Sign Days of Exercise per Week: 2 days Minutes of Exercise per Session: 30 min Stress: No Stress Concern Present (09/24/2017) Received from [facility] of Occupational Health - Occupational Stress Questionnaire Feeling of Stress : Not at all Social Connections: Unknown (09/30/2017) Received from [facility] Social Connection and Isolation Panel Frequency of Communication with Friends  and Family: More than three times a week Frequency of Social Gatherings with  Friends and Family: Once a week Attends Banker Meetings: 1 to 4 times per year Marital Status: Never married Housing Stability: Unknown (12/17/2023) Housing Stability Vital Sign Homeless in the Last Year: No  Objective:  Vitals: 12/17/23 0910 BP: 130/84 Pulse: 90 Temp: 36.6 C (97.8 F) SpO2: 99% Weight: 100.2 kg (220 lb 12.8 oz) Height: 160 cm (5' 3) PainSc: 6 PainLoc: Abdomen  Body mass index is 39.11 kg/m.  PE Chaperone note: No sensitive exam performed  Constitutional: NAD; conversant; no deformities Eyes: Moist conjunctiva; no lid lag; anicteric; PERRL Neck: Trachea midline; no thyromegaly Lungs: Normal respiratory effort; no tactile fremitus CV: RRR; no palpable thrills; no pitting edema GI: Abd soft, nd, mild TTP RUQ; no palpable hepatosplenomegaly MSK: Normal gait; no clubbing/cyanosis Psychiatric: Appropriate affect; alert and oriented x3 Lymphatic: No palpable cervical or axillary lymphadenopathy Skin:no rash/jaundice  Labs, Imaging and Diagnostic Testing: Labs from December 15, 2023-lipase, hepatic function, troponin, pregnancy-negative CBC showed a white count of 12.4, hemoglobin 11.6, normal basic metabolic panel  Right upper quadrant ultrasound December 15, 2023  FINDINGS:  LIVER: The liver demonstrates normal echogenicity. No intrahepatic biliary ductal dilatation. No evidence of mass. Antegrade flow signal in the portal vein is noted.  BILIARY SYSTEM: Gallbladder wall thickness measures 2.5 mm. Multiple layering gallstones measuring 1.6 cm in diameter. Negative sonographic Murphy's sign. The common bile duct measures 3.8 mm.   OTHER: No right upper quadrant ascites.  IMPRESSION: 1. Cholelithiasis without sonographic evidence of acute cholecystitis.  CT angio chest November 4 Negative, 1.3 cm soft tissue nodule in the left breast  ED notes from 11/4 and 11/5  Assessment and Plan:   Diagnoses and all orders for this  visit:  Symptomatic cholelithiasis    Assessment & Plan Cholelithiasis with recurrent biliary colic Recurrent biliary colic due to gallstones, presenting with right upper quadrant pain radiating to the back, nausea, vomiting, and fever. Symptoms consistent with gallbladder attacks. No current fever or chills. Previous ER visit treated with antiemetics and hydrocodone . Surgical intervention recommended due to recurrent symptoms and presence of gallstones. - Scheduled laparoscopic cholecystectomy. - Advised low-fat diet to reduce risk of future attacks. - Discussed potential postoperative symptoms including altered bowel habits and pain management. - Discussed risks of surgery including infection, bile leak, and injury to the common bile duct. - Arranged follow-up appointment 2-3 weeks post-surgery.  I believe the patient's symptoms are consistent with gallbladder disease.  We discussed gallbladder disease. The patient was given agricultural engineer. We discussed non-operative and operative management. We discussed the signs & symptoms of acute cholecystitis  I discussed laparoscopic cholecystectomy with possible IOC in detail. The patient was given educational material as well as diagrams detailing the procedure. We discussed the risks and benefits of a laparoscopic cholecystectomy including, but not limited to bleeding, infection, injury to surrounding structures such as the intestine or liver, bile leak, retained gallstones, need to convert to an open procedure, prolonged diarrhea, blood clots such as DVT, common bile duct injury, anesthesia risks, and possible need for additional procedures. We discussed the typical post-operative recovery course. I explained that the likelihood of improvement of their symptoms is good.  This patient encounter took 31 minutes today to perform the following: take history, perform exam, review outside records, interpret imaging, counsel the patient on their  diagnosis and document encounter, findings & plan in the EHR  No follow-ups on file.  This  note has been created using automated tools and reviewed for accuracy by Delray Reza MCADAMS Misty Thompson.  Diamone Whistler Misty BLUSH, MD General, Minimally Invasive, & Bariatric Surgery     Electronically signed by Thompson Camellia Daralyn, MD at 12/17/2023 9:29 AM EST

## 2023-12-31 NOTE — Transfer of Care (Signed)
 Immediate Anesthesia Transfer of Care Note  Patient: Misty Thompson  Procedure(s) Performed: LAPAROSCOPIC CHOLECYSTECTOMY (Abdomen)  Patient Location: PACU  Anesthesia Type:General  Level of Consciousness: awake and alert   Airway & Oxygen Therapy: Patient Spontanous Breathing and Patient connected to face mask oxygen  Post-op Assessment: Report given to RN and Post -op Vital signs reviewed and stable  Post vital signs: Reviewed and stable  Last Vitals:  Vitals Value Taken Time  BP 162/99 12/31/23 08:56  Temp    Pulse 87 12/31/23 09:00  Resp 19 12/31/23 09:00  SpO2 100 % 12/31/23 09:00  Vitals shown include unfiled device data.  Last Pain:  Vitals:   12/31/23 0630  TempSrc:   PainSc: 0-No pain         Complications: No notable events documented.

## 2023-12-31 NOTE — Discharge Instructions (Signed)

## 2024-01-01 ENCOUNTER — Encounter (HOSPITAL_COMMUNITY): Payer: Self-pay | Admitting: General Surgery

## 2024-01-01 LAB — SURGICAL PATHOLOGY

## 2024-01-05 ENCOUNTER — Other Ambulatory Visit (HOSPITAL_BASED_OUTPATIENT_CLINIC_OR_DEPARTMENT_OTHER): Payer: Self-pay

## 2024-02-17 ENCOUNTER — Other Ambulatory Visit (HOSPITAL_BASED_OUTPATIENT_CLINIC_OR_DEPARTMENT_OTHER): Payer: Self-pay

## 2024-03-21 ENCOUNTER — Ambulatory Visit: Payer: Self-pay
